# Patient Record
Sex: Male | Born: 1985 | Race: White | Hispanic: No | Marital: Married | Smoking: Never smoker
Health system: Southern US, Community
[De-identification: ages and names within clinical notes are randomized; demographics above are authoritative.]

## PROBLEM LIST (undated history)

## (undated) DIAGNOSIS — R030 Elevated blood-pressure reading, without diagnosis of hypertension: Principal | ICD-10-CM

## (undated) HISTORY — DX: Elevated blood-pressure reading, without diagnosis of hypertension: R03.0

---

## 2015-09-20 ENCOUNTER — Ambulatory Visit (INDEPENDENT_AMBULATORY_CARE_PROVIDER_SITE_OTHER): Payer: 59 | Admitting: Osteopathic Medicine

## 2015-09-20 ENCOUNTER — Encounter: Payer: Self-pay | Admitting: Osteopathic Medicine

## 2015-09-20 VITALS — BP 150/90 | HR 79 | Ht 70.0 in | Wt 287.0 lb

## 2015-09-20 DIAGNOSIS — R030 Elevated blood-pressure reading, without diagnosis of hypertension: Secondary | ICD-10-CM | POA: Diagnosis not present

## 2015-09-20 DIAGNOSIS — R42 Dizziness and giddiness: Secondary | ICD-10-CM

## 2015-09-20 DIAGNOSIS — R112 Nausea with vomiting, unspecified: Secondary | ICD-10-CM

## 2015-09-20 DIAGNOSIS — Z23 Encounter for immunization: Secondary | ICD-10-CM | POA: Diagnosis not present

## 2015-09-20 DIAGNOSIS — IMO0001 Reserved for inherently not codable concepts without codable children: Secondary | ICD-10-CM | POA: Insufficient documentation

## 2015-09-20 HISTORY — DX: Reserved for inherently not codable concepts without codable children: IMO0001

## 2015-09-20 MED ORDER — MECLIZINE HCL 25 MG PO CHEW: 25.0000 mg | CHEWABLE_TABLET | Freq: Two times a day (BID) | ORAL | Status: DC | PRN

## 2015-09-20 MED ORDER — CHLORTHALIDONE 25 MG PO TABS: 25.0000 mg | ORAL_TABLET | Freq: Every day | ORAL | Status: DC

## 2015-09-20 NOTE — Patient Instructions (Signed)

## 2015-09-20 NOTE — Progress Notes (Signed)
HPI: Johnathan Goodman is a 30 y.o. male who presents to [facility] today for chief complaint of:  Chief Complaint  Patient presents with  . Establish Care  . Headache  . Nausea   DIZZY/NAUSEA . Quality: dizzy . Duration: usually lasted about a few minutes to an hour then then goes away on its own . Timing: see below for recent episode, seems worse with rising from seated/lying down . Context: moment of dizziness at work, resolved but then got worse later in the day followed by nausea. Some dizziness previously, this is the first time caused nausea. Wife reports sporadic episodes of this for the past few months . Assoc signs/symptoms: Feels like spinning, no blackouts/LOC, no hx concussion.   HTN: BP 173/93 at intake on automatic cuffno history of high BP, pt adopted and does not know FH. Denies CP/SOB. Has been several years since last doctor visit/BP check.   Review of previous records:  BP 143/90 on [facility] records from 06/16/12 at which time he was seen and treated for Nausea/Vomiting, started on Omeprazole, Glc 101 at that time, not sure whether fasting. CMP otherwise normal at that time.     Past medical, social and family history reviewed: History reviewed. No pertinent past medical history. History reviewed. No pertinent past surgical history. Social History  Substance Use Topics  . Smoking status: Not on file  . Smokeless tobacco: Not on file  . Alcohol Use: Not on file   History reviewed. No pertinent family history.  No current outpatient prescriptions on file.   No current facility-administered medications for this visit.   No Known Allergies    Review of Systems: CONSTITUTIONAL:  No  fever, no chills, No  unintentional weight changes HEAD/EYES/EARS/NOSE/THROAT: (+) headache, no vision change, no hearing change, No  sore throat, No  sinus pressure CARDIAC: No  chest pain, No  pressure, No palpitations, No  orthopnea RESPIRATORY: No   cough, No  shortness of breath/wheeze GASTROINTESTINAL: (+) nausea, No  vomiting, No  abdominal pain, No  blood in stool, No  diarrhea, No  constipation  MUSCULOSKELETAL: No  myalgia/arthralgia GENITOURINARY: No  incontinence, No  abnormal genital bleeding/discharge SKIN: No  rash/wounds/concerning lesions HEM/ONC: No  easy bruising/bleeding, No  abnormal lymph node ENDOCRINE: No polyuria/polydipsia/polyphagia, No  heat/cold intolerance  NEUROLOGIC: No  weakness, (+) dizziness, No  slurred speech PSYCHIATRIC: No  concerns with depression, No  concerns with anxiety, No sleep problems  Exam:  BP 150/90 mmHg  Pulse 79  Ht 5\' 10"  (1.778 m)  Wt 287 lb (130.182 kg)  BMI 41.18 kg/m2 Constitutional: VS see above. General Appearance: alert, well-developed, well-nourished, NAD Eyes: Normal lids and conjunctive, non-icteric sclera, PERRLA Ears, Nose, Mouth, Throat: MMM, Normal external inspection ears/nares/mouth/lips/gums, TM normal, pharynx normal Neck: No masses, trachea midline. No thyroid enlargement/tenderness/mass appreciated. No lymphadenopathy Respiratory: Normal respiratory effort. no wheeze, no rhonchi, no rales Cardiovascular: S1/S2 normal, no murmur, no rub/gallop auscultated. RRR.  No carotid bruit or JVD. No abdominal aortic bruit. Pedal pulse II/IV bilaterally DP and PT. No lower extremity edema. Gastrointestinal: Nontender, no masses. No hepatomegaly, no splenomegaly. No hernia appreciated. Bowel sounds normal. Rectal exam deferred.  Musculoskeletal: Gait normal. No clubbing/cyanosis of digits.  Neurological: No cranial nerve deficit on limited exam. Motor and sensation intact and symmetric, cerebellar reflexes intact, EOMI no nystagmus Skin: warm, dry, intact. No rash/ulcer. No concerning nevi or subq nodules on limited exam.   Psychiatric: Normal judgment/insight. Normal mood and  affect. Oriented x3.    No results found for this or any previous visit (from the past 72  hour(s)).  EKG interpretation: Rate: 72 Rhythm: sinus No ST/T changes concerning for acute ischemia/infarct  Small Q waves II, III  Orthostatic VS - negative: see nurse notes/extended VS    ASSESSMENT/PLAN: ER precautions reviewed in detail. Pt instructions printed. F/u next week. May consider sleep study or w/u for other secondary cause based on labs and response to meds.   Elevated blood pressure - Plan: CBC with Differential/Platelet, COMPLETE METABOLIC PANEL WITH GFR, Hemoglobin A1c, Lipid panel, Magnesium, Urinalysis, chlorthalidone (HYGROTON) 25 MG tablet  Need for prophylactic vaccination and inoculation against influenza - Plan: Flu Vaccine QUAD 36+ mos IM  Dizziness - Plan: Hemoglobin A1c, TSH, Meclizine HCl 25 MG CHEW  Non-intractable vomiting with nausea, unspecified vomiting type - Plan: CBC with Differential/Platelet, COMPLETE METABOLIC PANEL WITH GFR, TSH, Meclizine HCl 25 MG CHEW   Return in about 5 days (around 09/25/2015), or sooner if needed, for RECHECK BLOOD PRESSURE, GO OVER LAB RESULTS IN DETAIL, RECHECK SYMPTOMS.

## 2015-09-21 LAB — URINALYSIS
BILIRUBIN URINE: NEGATIVE
GLUCOSE, UA: NEGATIVE
HGB URINE DIPSTICK: NEGATIVE
KETONES UR: NEGATIVE
Leukocytes, UA: NEGATIVE
Nitrite: NEGATIVE
PH: 5.5 (ref 5.0–8.0)
Protein, ur: NEGATIVE
Specific Gravity, Urine: 1.023 (ref 1.001–1.035)

## 2015-09-21 LAB — CBC WITH DIFFERENTIAL/PLATELET
Basophils Absolute: 0 10*3/uL (ref 0.0–0.1)
Basophils Relative: 0 % (ref 0–1)
EOS PCT: 2 % (ref 0–5)
Eosinophils Absolute: 0.2 10*3/uL (ref 0.0–0.7)
HEMATOCRIT: 44 % (ref 39.0–52.0)
Hemoglobin: 14.9 g/dL (ref 13.0–17.0)
LYMPHS ABS: 2.8 10*3/uL (ref 0.7–4.0)
LYMPHS PCT: 31 % (ref 12–46)
MCH: 28.6 pg (ref 26.0–34.0)
MCHC: 33.9 g/dL (ref 30.0–36.0)
MCV: 84.5 fL (ref 78.0–100.0)
MONO ABS: 0.8 10*3/uL (ref 0.1–1.0)
MONOS PCT: 9 % (ref 3–12)
MPV: 10.6 fL (ref 8.6–12.4)
Neutro Abs: 5.2 10*3/uL (ref 1.7–7.7)
Neutrophils Relative %: 58 % (ref 43–77)
Platelets: 242 10*3/uL (ref 150–400)
RBC: 5.21 MIL/uL (ref 4.22–5.81)
RDW: 14.1 % (ref 11.5–15.5)
WBC: 8.9 10*3/uL (ref 4.0–10.5)

## 2015-09-21 LAB — COMPLETE METABOLIC PANEL WITH GFR
ALK PHOS: 68 U/L (ref 40–115)
ALT: 27 U/L (ref 9–46)
AST: 19 U/L (ref 10–40)
Albumin: 4.2 g/dL (ref 3.6–5.1)
BUN: 11 mg/dL (ref 7–25)
CALCIUM: 9.1 mg/dL (ref 8.6–10.3)
CHLORIDE: 100 mmol/L (ref 98–110)
CO2: 32 mmol/L — ABNORMAL HIGH (ref 20–31)
Creat: 1 mg/dL (ref 0.60–1.35)
Glucose, Bld: 82 mg/dL (ref 65–99)
POTASSIUM: 4 mmol/L (ref 3.5–5.3)
Sodium: 142 mmol/L (ref 135–146)
Total Bilirubin: 1 mg/dL (ref 0.2–1.2)
Total Protein: 7 g/dL (ref 6.1–8.1)

## 2015-09-21 LAB — LIPID PANEL
CHOLESTEROL: 179 mg/dL (ref 125–200)
HDL: 33 mg/dL — ABNORMAL LOW (ref >=40)
LDL Cholesterol: 93 mg/dL (ref <130)
TRIGLYCERIDES: 265 mg/dL — AB (ref <150)
Total CHOL/HDL Ratio: 5.4 Ratio — ABNORMAL HIGH (ref <=5.0)
VLDL: 53 mg/dL — ABNORMAL HIGH (ref <30)

## 2015-09-21 LAB — TSH: TSH: 3.136 u[IU]/mL (ref 0.350–4.500)

## 2015-09-21 LAB — HEMOGLOBIN A1C
HEMOGLOBIN A1C: 5.7 % — AB (ref <5.7)
MEAN PLASMA GLUCOSE: 117 mg/dL — AB (ref <117)

## 2015-09-21 LAB — MAGNESIUM: MAGNESIUM: 1.9 mg/dL (ref 1.5–2.5)

## 2015-09-26 ENCOUNTER — Encounter: Payer: Self-pay | Admitting: Osteopathic Medicine

## 2015-09-26 ENCOUNTER — Ambulatory Visit (INDEPENDENT_AMBULATORY_CARE_PROVIDER_SITE_OTHER): Payer: 59 | Admitting: Osteopathic Medicine

## 2015-09-26 VITALS — BP 139/87 | HR 82 | Ht 69.0 in | Wt 281.0 lb

## 2015-09-26 DIAGNOSIS — R030 Elevated blood-pressure reading, without diagnosis of hypertension: Secondary | ICD-10-CM

## 2015-09-26 DIAGNOSIS — R42 Dizziness and giddiness: Secondary | ICD-10-CM | POA: Diagnosis not present

## 2015-09-26 DIAGNOSIS — IMO0001 Reserved for inherently not codable concepts without codable children: Secondary | ICD-10-CM

## 2015-09-26 NOTE — Patient Instructions (Signed)
Diabetes Mellitus and Food It is important for you to manage your blood sugar (glucose) level. Your blood glucose level can be greatly affected by what you eat. Eating healthier foods in the appropriate amounts throughout the day at about the same time each day will help you control your blood glucose level. It can also help slow or prevent worsening of your diabetes mellitus. Healthy eating may even help you improve the level of your blood pressure and reach or maintain a healthy weight.  General recommendations for healthful eating and cooking habits include:  Eating meals and snacks regularly. Avoid going long periods of time without eating to lose weight.  Eating a diet that consists mainly of plant-based foods, such as fruits, vegetables, nuts, legumes, and whole grains.  Using low-heat cooking methods, such as baking, instead of high-heat cooking methods, such as deep frying. Work with your dietitian to make sure you understand how to use the Nutrition Facts information on food labels. HOW CAN FOOD AFFECT ME? Carbohydrates Carbohydrates affect your blood glucose level more than any other type of food. Your dietitian will help you determine how many carbohydrates to eat at each meal and teach you how to count carbohydrates. Counting carbohydrates is important to keep your blood glucose at a healthy level, especially if you are using insulin or taking certain medicines for diabetes mellitus. Alcohol Alcohol can cause sudden decreases in blood glucose (hypoglycemia), especially if you use insulin or take certain medicines for diabetes mellitus. Hypoglycemia can be a life-threatening condition. Symptoms of hypoglycemia (sleepiness, dizziness, and disorientation) are similar to symptoms of having too much alcohol.  If your health care provider has given you approval to drink alcohol, do so in moderation and use the following guidelines:  Women should not have more than one drink per day, and men  should not have more than two drinks per day. One drink is equal to:  12 oz of beer.  5 oz of wine.  1 oz of hard liquor.  Do not drink on an empty stomach.  Keep yourself hydrated. Have water, diet soda, or unsweetened iced tea.  Regular soda, juice, and other mixers might contain a lot of carbohydrates and should be counted. WHAT FOODS ARE NOT RECOMMENDED? As you make food choices, it is important to remember that all foods are not the same. Some foods have fewer nutrients per serving than other foods, even though they might have the same number of calories or carbohydrates. It is difficult to get your body what it needs when you eat foods with fewer nutrients. Examples of foods that you should avoid that are high in calories and carbohydrates but low in nutrients include:  Trans fats (most processed foods list trans fats on the Nutrition Facts label).  Regular soda.  Juice.  Candy.  Sweets, such as cake, pie, doughnuts, and cookies.  Fried foods. WHAT FOODS CAN I EAT? Eat nutrient-rich foods, which will nourish your body and keep you healthy. The food you should eat also will depend on several factors, including:  The calories you need.  The medicines you take.  Your weight.  Your blood glucose level.  Your blood pressure level.  Your cholesterol level. You should eat a variety of foods, including:  Protein.  Lean cuts of meat.  Proteins low in saturated fats, such as fish, egg whites, and beans. Avoid processed meats.  Fruits and vegetables.  Fruits and vegetables that may help control blood glucose levels, such as apples, mangoes, and   yams.  Dairy products.  Choose fat-free or low-fat dairy products, such as milk, yogurt, and cheese.  Grains, bread, pasta, and rice.  Choose whole grain products, such as multigrain bread, whole oats, and brown rice. These foods may help control blood pressure.  Fats.  Foods containing healthful fats, such as nuts,  avocado, olive oil, canola oil, and fish. DOES EVERYONE WITH DIABETES MELLITUS HAVE THE SAME MEAL PLAN? Because every person with diabetes mellitus is different, there is not one meal plan that works for everyone. It is very important that you meet with a dietitian who will help you create a meal plan that is just right for you.   This information is not intended to replace advice given to you by your health care provider. Make sure you discuss any questions you have with your health care provider.   Document Released: 05/23/2005 Document Revised: 09/16/2014 Document Reviewed: 07/23/2013 Elsevier Interactive Patient Education 2016 Elsevier Inc.  Cholesterol Cholesterol is a white, waxy, fat-like substance needed by your body in small amounts. The liver makes all the cholesterol you need. Cholesterol is carried from the liver by the blood through the blood vessels. Deposits of cholesterol (plaque) may build up on blood vessel walls. These make the arteries narrower and stiffer. Cholesterol plaques increase the risk for heart attack and stroke.  You cannot feel your cholesterol level even if it is very high. The only way to know it is high is with a blood test. Once you know your cholesterol levels, you should keep a record of the test results. Work with your health care provider to keep your levels in the desired range.  WHAT DO THE RESULTS MEAN?  Total cholesterol is a rough measure of all the cholesterol in your blood.   LDL is the so-called bad cholesterol. This is the type that deposits cholesterol in the walls of the arteries. You want this level to be low.   HDL is the good cholesterol because it cleans the arteries and carries the LDL away. You want this level to be high.  Triglycerides are fat that the body can either burn for energy or store. High levels are closely linked to heart disease.  WHAT ARE THE DESIRED LEVELS OF CHOLESTEROL?  Total cholesterol below 200.   LDL below  100 for people at risk, below 70 for those at very high risk.   HDL above 50 is good, above 60 is best.   Triglycerides below 150.  HOW CAN I LOWER MY CHOLESTEROL?  Diet. Follow your diet programs as directed by your health care provider.   Choose fish or white meat chicken and [place], roasted or baked. Limit fatty cuts of red meat, fried foods, and processed meats, such as sausage and lunch meats.   Eat lots of fresh fruits and vegetables.  Choose whole grains, beans, pasta, potatoes, and cereals.   Use only small amounts of olive, corn, or canola oils.   Avoid butter, mayonnaise, shortening, or palm kernel oils.  Avoid foods with trans fats.   Drink skim or nonfat milk and eat low-fat or nonfat yogurt and cheeses. Avoid whole milk, cream, ice cream, egg yolks, and full-fat cheeses.   Healthy desserts include angel food cake, ginger snaps, animal crackers, hard candy, popsicles, and low-fat or nonfat frozen yogurt. Avoid pastries, cakes, pies, and cookies.   Exercise. Follow your exercise programs as directed by your health care provider.   A regular program helps decrease LDL and raise HDL.  A regular program helps with weight control.   Do things that increase your activity level like gardening, walking, or taking the stairs. Ask your health care provider about how you can be more active in your daily life.   Medicine. Take medicine only as directed by your health care provider.   Medicine may be prescribed by your health care provider to help lower cholesterol and decrease the risk for heart disease.   If you have several risk factors, you may need medicine even if your levels are normal.   This information is not intended to replace advice given to you by your health care provider. Make sure you discuss any questions you have with your health care provider.   Document Released: 05/21/2001 Document Revised: 09/16/2014 Document Reviewed:  06/09/2013 Elsevier Interactive Patient Education [employer]

## 2015-09-26 NOTE — Progress Notes (Signed)
HPI: Johnathan Goodman is a 30 y.o. male who presents to [facility] today for chief complaint of:  Chief Complaint  Patient presents with  . Follow-up    blood pressure   DIZZY/NAUSEA - pt initially seen for this last week. BP quite high, EKG and orthostatics ok. Labs drawn. Pt here today for BP recheck and lab results review. Labs ok except borderline cholesterol, A1C 5.7 = mild prediabetes. Pt has had no additional episodes dizziness. Has had a few night sweats and fatigue.    HTN: BP 173/93 at intake on  Last visit, no history of high BP, pt adopted and does not know FH. Denies CP/SOB. Review old records showed BP 143/90 on [facility] records from 06/16/12. Last visit started on chlorthalidone.     Past medical, social and family history reviewed: Past Medical History  Diagnosis Date  . Elevated blood pressure 09/20/2015   No past surgical history on file. Social History  Substance Use Topics  . Smoking status: Never Smoker   . Smokeless tobacco: Not on file  . Alcohol Use: Not on file   Family History  Problem Relation Age of Onset  . Adopted: Yes    Current Outpatient Prescriptions  Medication Sig Dispense Refill  . chlorthalidone (HYGROTON) 25 MG tablet Take 1 tablet (25 mg total) by mouth daily. 300 tablet 0  . Meclizine HCl 25 MG CHEW Chew 1 tablet (25 mg total) by mouth 2 (two) times daily as needed (dizziness/nausea). 90 each 0   No current facility-administered medications for this visit.   No Known Allergies    Review of Systems: CONSTITUTIONAL:  No  fever, no chills, No  unintentional weight changes HEAD/EYES/EARS/NOSE/THROAT: No headache, no vision change, no hearing change, No  sore throat, No  sinus pressure CARDIAC: No  chest pain, No  pressure, No palpitations, No  orthopnea RESPIRATORY: No  cough, No  shortness of breath/wheeze MUSCULOSKELETAL: No  myalgia/arthralgia ENDOCRINE: No polyuria/polydipsia/polyphagia, No  heat/cold  intolerance  NEUROLOGIC: No  weakness, No dizziness, No  slurred speech  Exam:  BP 139/87 mmHg  Pulse 82  Ht  (1.753 m)  Wt 281 lb (127.461 kg)  BMI 41.48 kg/m2 Constitutional: VS see above. General Appearance: alert, well-developed, well-nourished, NAD Eyes: Normal lids and conjunctive, non-icteric sclera, Respiratory: Normal respiratory effort. no wheeze, no rhonchi, no rales Cardiovascular: S1/S2 normal, no murmur, no rub/gallop auscultated. RRR.    ASSESSMENT/PLAN: ER precautions reviewed in detail. Pt instructions printed re: diet, discussed exercise and lifestyle modifications and plan to repeat A1C and lipids next 3 - 6 months. Check BMP 8 weeks after starting meds  Elevated blood pressure - improved on Chlorthalidone, RTC 3 months for recheck.  - Plan: Ambulatory referral to Sleep Studies, BASIC METABOLIC PANEL WITH GFR  Dizziness - resolved, pt has meclizine on hand, RTC if recurs   Return in about 3 months (around 12/25/2015), or sooner if needed, if dizziness comes back or other concerns, for BLOOD PRESSURE RECHECK. Return early March 2017 for lab redraw

## 2015-09-28 NOTE — Addendum Note (Signed)
Addended by: Collie Siad on: 09/28/2015 03:22 PM   Modules accepted: Orders

## 2015-10-20 ENCOUNTER — Other Ambulatory Visit: Payer: Self-pay | Admitting: Osteopathic Medicine

## 2015-10-20 DIAGNOSIS — IMO0001 Reserved for inherently not codable concepts without codable children: Secondary | ICD-10-CM

## 2015-10-20 DIAGNOSIS — R030 Elevated blood-pressure reading, without diagnosis of hypertension: Principal | ICD-10-CM

## 2015-10-20 MED ORDER — CHLORTHALIDONE 25 MG PO TABS: 25.0000 mg | ORAL_TABLET | Freq: Every day | ORAL | Status: DC

## 2015-10-23 ENCOUNTER — Other Ambulatory Visit: Payer: Self-pay | Admitting: Osteopathic Medicine

## 2015-10-25 ENCOUNTER — Telehealth: Payer: Self-pay | Admitting: Osteopathic Medicine

## 2015-10-25 NOTE — Telephone Encounter (Signed)
I called pt to let him know he is due for a f/u appt on his meds and BP BUT his voicemail is not set up. Thanks

## 2015-12-27 ENCOUNTER — Other Ambulatory Visit: Payer: Self-pay | Admitting: Osteopathic Medicine

## 2016-01-02 ENCOUNTER — Telehealth: Payer: Self-pay | Admitting: Osteopathic Medicine

## 2016-01-02 NOTE — Telephone Encounter (Signed)
I called pt to let him know that he is due for a F/u Appt on BP with Dr.Alexander and his voicemail is NOT set up for me to leave a message

## 2016-03-16 ENCOUNTER — Other Ambulatory Visit: Payer: Self-pay | Admitting: Osteopathic Medicine

## 2016-03-18 ENCOUNTER — Other Ambulatory Visit: Payer: Self-pay | Admitting: Osteopathic Medicine

## 2016-03-28 ENCOUNTER — Other Ambulatory Visit: Payer: Self-pay | Admitting: Osteopathic Medicine

## 2016-03-29 ENCOUNTER — Other Ambulatory Visit: Payer: Self-pay | Admitting: Osteopathic Medicine

## 2016-04-01 ENCOUNTER — Ambulatory Visit (INDEPENDENT_AMBULATORY_CARE_PROVIDER_SITE_OTHER): Payer: 59 | Admitting: Osteopathic Medicine

## 2016-04-01 ENCOUNTER — Encounter: Payer: Self-pay | Admitting: Osteopathic Medicine

## 2016-04-01 ENCOUNTER — Ambulatory Visit (INDEPENDENT_AMBULATORY_CARE_PROVIDER_SITE_OTHER): Payer: 59

## 2016-04-01 VITALS — BP 133/87 | HR 79 | Ht 69.0 in | Wt 283.0 lb

## 2016-04-01 DIAGNOSIS — Z23 Encounter for immunization: Secondary | ICD-10-CM

## 2016-04-01 DIAGNOSIS — M79672 Pain in left foot: Secondary | ICD-10-CM | POA: Diagnosis not present

## 2016-04-01 DIAGNOSIS — I10 Essential (primary) hypertension: Secondary | ICD-10-CM

## 2016-04-01 LAB — BASIC METABOLIC PANEL WITH GFR
BUN: 10 mg/dL (ref 7–25)
CHLORIDE: 99 mmol/L (ref 98–110)
CO2: 30 mmol/L (ref 20–31)
CREATININE: 0.96 mg/dL (ref 0.60–1.35)
Calcium: 9.6 mg/dL (ref 8.6–10.3)
GFR, Est African American: >89 mL/min (ref >=60)
GFR, Est Non African American: >89 mL/min (ref >=60)
GLUCOSE: 102 mg/dL — AB (ref 65–99)
Potassium: 3.8 mmol/L (ref 3.5–5.3)
Sodium: 138 mmol/L (ref 135–146)

## 2016-04-01 MED ORDER — CHLORTHALIDONE 25 MG PO TABS: 25.0000 mg | ORAL_TABLET | Freq: Every day | ORAL | 2 refills | Status: DC

## 2016-04-01 NOTE — Progress Notes (Signed)
HPI: Johnathan Goodman is a 30 y.o. Not Hispanic or Latino male  who presents to [facility] [place] today, 04/01/16,  for chief complaint of:  Chief Complaint  Patient presents with  . Follow-up    BLOOD PRESSURE  . Foot Pain    LEFT     Hypertension - Needs refill, BP improved On the medication. Patient to for follow-up BMP to monitor kidney function as well.   L foot pain . Context: previous injury 3 years ago, little toe caught on doorway and yanked to side - swollen and bruised several days,  . Location: L foot, 5th digit, lateral foot, pain on ball of the foot  . Quality: aching . Duration: ongoing about 3 years  . Timing: on and off, doesn't notice any significant pattern. . Modifying factors: Ibuprofen helps, stressing the foot 10 make it worse. Patient does occasionally have some difficulty with exercise such as jogging/elliptical/bike causing the foot to hurt . Assoc signs/symptoms: No numbness    Past medical, surgical, social and family history reviewed: Past Medical History:  Diagnosis Date  . Elevated blood pressure 09/20/2015   No past surgical history on file. Social History  Substance Use Topics  . Smoking status: Never Smoker  . Smokeless tobacco: Not on file  . Alcohol use Not on file   Family History  Problem Relation Age of Onset  . Adopted: Yes     Current medication list and allergy/intolerance information reviewed:   Current Outpatient Prescriptions  Medication Sig Dispense Refill  . chlorthalidone (HYGROTON) 25 MG tablet Take 1 tablet (25 mg total) by mouth daily. MUST KEEP APPOINTMENT 15 tablet 0  . Meclizine HCl 25 MG CHEW Chew 1 tablet (25 mg total) by mouth 2 (two) times daily as needed (dizziness/nausea). 90 each 0   No current facility-administered medications for this visit.    No Known Allergies    Review of Systems:  Constitutional:  No  fever, no chills, No recent illness. No significant fatigue.   HEENT:  No  headache, no vision change  Cardiac: No  chest pain, No  pressure, No palpitations,  Respiratory:  No  shortness of breath.  Musculoskeletal: (+) myalgia/arthralgia as per HPI  Neurologic: No  weakness, No  dizziness,    Exam:  BP 133/87   Pulse 79   Ht 5\' 9"  (1.753 m)   Wt 283 lb (128.4 kg)   BMI 41.79 kg/m   Constitutional: VS see above. General Appearance: alert, well-developed, well-nourished, NAD  Respiratory: Normal respiratory effort. no wheeze, no rhonchi, no rales  Cardiovascular: S1/S2 normal, no murmur, no rub/gallop auscultated. RRR. No lower extremity edema.  Musculoskeletal: Gait normal. No clubbing/cyanosis of digits. No visible deformity to left foot, no pain with eversion/inversion, drawer test negative, no tenderness to fifth MTP, no estrogen a mission to any of the toes, no swelling/ecchymoses.   Neurological: No cranial nerve deficit on limited exam. Motor and sensation intact and symmetric.  Skin: warm, dry, intact. No rash/ulcer    Dg Foot Complete Left  Result Date: 04/01/2016 CLINICAL DATA:  Direct trauma to the left fifth toe 3 years ago on a door frame with dislocation at the time; patient has experienced intermittent pain in the left fifth toe and fifth metatarsal since then. EXAM: LEFT FOOT - COMPLETE 3+ VIEW COMPARISON:  None in PACs FINDINGS: The bones are adequately mineralized. There is no acute fracture nor dislocation. Subtle cortical contour deformity of the lateral aspect of the  shaft of the proximal phalanx of the fifth toe may reflect the sequelae of previous injury. The metatarsals are intact. The joint spaces are preserved. The bones of the hindfoot are unremarkable. The soft tissues exhibit no abnormal swelling. IMPRESSION: There is no acute bony abnormality of the left foot. There are are likely old post traumatic changes of the proximal shaft of the proximal phalanx of the left fifth toe. Electronically Signed   By: David  [place] M.D.    On: 04/01/2016 11:25 Personally reviewed XR    ASSESSMENT/PLAN:   Essential hypertension - Update BMP for medication safety, continue current medications. Due for routine hypertensive screening January 2018 - Plan: chlorthalidone (HYGROTON) 25 MG tablet, BASIC METABOLIC PANEL WITH GFR  Left foot pain - X-ray as above, less likely occult/new fracture but consider MRI if no better, sports consult for possible orthotics - Plan: DG Foot Complete Left     Visit summary with medication list and pertinent instructions was printed for patient to review. All questions at time of visit were answered - patient instructed to contact office with any additional concerns. ER/RTC precautions were reviewed with the patient. Follow-up plan: Return in about 6 months (around 10/02/2016), or sooner if needed, for [facility] AND LABS.

## 2016-04-01 NOTE — Patient Instructions (Signed)
If foot pain persists - would recommend follow up with one of the sports medicine docs in our practice to discuss whether inserts or injections or other therapies may help the foot. Let us know! Otherwise, you'll be due for annual labs in January 2018!

## 2017-01-13 ENCOUNTER — Encounter: Payer: Self-pay | Admitting: Osteopathic Medicine

## 2017-01-13 ENCOUNTER — Other Ambulatory Visit: Payer: Self-pay | Admitting: Osteopathic Medicine

## 2017-01-13 ENCOUNTER — Ambulatory Visit (INDEPENDENT_AMBULATORY_CARE_PROVIDER_SITE_OTHER): Payer: BLUE CROSS/BLUE SHIELD | Admitting: Osteopathic Medicine

## 2017-01-13 VITALS — BP 129/83 | HR 93 | Ht 69.0 in | Wt 296.0 lb

## 2017-01-13 DIAGNOSIS — H6982 Other specified disorders of Eustachian tube, left ear: Secondary | ICD-10-CM | POA: Diagnosis not present

## 2017-01-13 MED ORDER — IPRATROPIUM BROMIDE 0.06 % NA SOLN: 2.0000 | Freq: Four times a day (QID) | NASAL | 0 refills | Status: DC

## 2017-01-13 MED ORDER — AMOXICILLIN-POT CLAVULANATE 875-125 MG PO TABS: 1.0000 | ORAL_TABLET | Freq: Two times a day (BID) | ORAL | 0 refills | Status: DC

## 2017-01-13 NOTE — Progress Notes (Signed)
HPI: Johnathan Goodman is a 31 y.o. male  who presents to [facility] Johnathan Goodman today, 01/13/17,  for chief complaint of:  Chief Complaint  Patient presents with  . Ear Pain    LEFT    Left ear concern . Context:Recent cold few weeks ago. Patient is not consistently taking allergy medications as he seemed to notice feeling a whole lot worse when he would skip a day by accident. . Location: Left ear . Quality: Feeling of fullness, like something is stuck in there. No hearing change. . Duration: Worse over past few days . Modifying factors: Sudafed not particularly helpful, has also tried Afrin which didn't help. Ibuprofen helped a bit with the discomfort. . Assoc signs/symptoms: No fever/chills, no jaw pain. Some sinus congestion bilaterally  Hypertension: On chlorthalidone, states not due for refills of medication. No chest pain, pressure, shortness of breath. Due for annual physical    Past medical history, surgical history, social history and family history reviewed.  Patient Active Problem List   Diagnosis Date Noted  . Left foot pain 04/01/2016  . Dizziness 09/26/2015  . Elevated blood pressure 09/20/2015    Current medication list and allergy/intolerance information reviewed.   Current Outpatient Prescriptions on File Prior to Visit  Medication Sig Dispense Refill  . chlorthalidone (HYGROTON) 25 MG tablet Take 1 tablet (25 mg total) by mouth daily. MUST KEEP APPOINTMENT 90 tablet 2   No current facility-administered medications on file prior to visit.    No Known Allergies    Review of Systems:  Constitutional: No recent illness  HEENT: No  headache, no vision change  Cardiac: No  chest pain, No  pressure, No palpitations  Respiratory:  No  shortness of breath. No  Cough  Gastrointestinal: No  abdominal pain, no change on bowel habits  Musculoskeletal: No new myalgia/arthralgia  Skin: No  Rash  Hem/Onc: No  easy bruising/bleeding, No   abnormal lumps/bumps  Neurologic: No  weakness, No  Dizziness  Psychiatric: No  concerns with depression, No  concerns with anxiety  Exam:  BP 129/83   Pulse 93   Ht 5\' 9"  (1.753 m)   Wt 296 lb (134.3 kg)   BMI 43.71 kg/m   Constitutional: VS see above. General Appearance: alert, well-developed, well-nourished, NAD  Eyes: Normal lids and conjunctive, non-icteric sclera  Ears, Nose, Mouth, Throat: MMM, Normal external inspection ears/nares/mouth/lips/gums.Normal nasopharynx. Tympanic membranes normal on right. On left, external canal clear. Light reflex intact, landmarks visible, membrane is not dull or bulging. There is some clear effusion behind the TM but also some orange-colored consistent with possible serosanguineous effusion in inferior posterior quadrant. No hemotympanum  Neck: No masses, trachea midline. No lymphadenopathy  Respiratory: Normal respiratory effort.        ASSESSMENT/PLAN:   Eustachian tube dysfunction, left - Plan: ipratropium (ATROVENT) 0.06 % nasal spray, amoxicillin-clavulanate (AUGMENTIN) 875-125 MG tablet    Patient Instructions  Plan: I think the eustachian tube is blocked, possibly due to inflammation from allergies or recent illness, possibly due to an early bacterial infection 1. Try OTC treatments with Sudafed + Allegra/Claritin/Zyrtec, can use nasal saline spray, add Atrovent prescription. Try these medicines for the next few days.  2. If not working or if pain is getting more bothersome, fill the prescription for the antibiotic 3. If nothing is helping, or if you continue to worsen, please come see [place] in the office. We may need to send to an Ear-Nose-Throat specialist  Follow-up plan: Return if symptoms worsen or fail to improve, and next few months when due for annual physical.  Visit summary with medication list and pertinent instructions was printed for patient to review, alert us if any changes needed. All questions at time  of visit were answered - patient instructed to contact office with any additional concerns. ER/RTC precautions were reviewed with the patient and understanding verbalized.

## 2017-01-13 NOTE — Patient Instructions (Signed)
Plan: I think the eustachian tube is blocked, possibly due to inflammation from allergies or recent illness, possibly due to an early bacterial infection 1. Try OTC treatments with Sudafed + Allegra/Claritin/Zyrtec, can use nasal saline spray, add Atrovent prescription. Try these medicines for the next few days.  2. If not working or if pain is getting more bothersome, fill the prescription for the antibiotic 3. If nothing is helping, or if you continue to worsen, please come see us in the office. We may need to send to an Ear-Nose-Throat specialist

## 2017-01-27 ENCOUNTER — Other Ambulatory Visit: Payer: Self-pay | Admitting: Osteopathic Medicine

## 2017-01-27 DIAGNOSIS — I10 Essential (primary) hypertension: Secondary | ICD-10-CM

## 2017-02-12 IMAGING — DX DG FOOT COMPLETE 3+V*L*
3 series · 3 of 3 positions shown · non-contrast
Comparison: None in PACs

CLINICAL DATA: Direct trauma to the left fifth toe 3 years ago on a
door frame with dislocation at the time; patient has experienced
intermittent pain in the left fifth toe and fifth metatarsal since
then.

EXAM:
LEFT FOOT - COMPLETE 3+ VIEW

[foot ap]
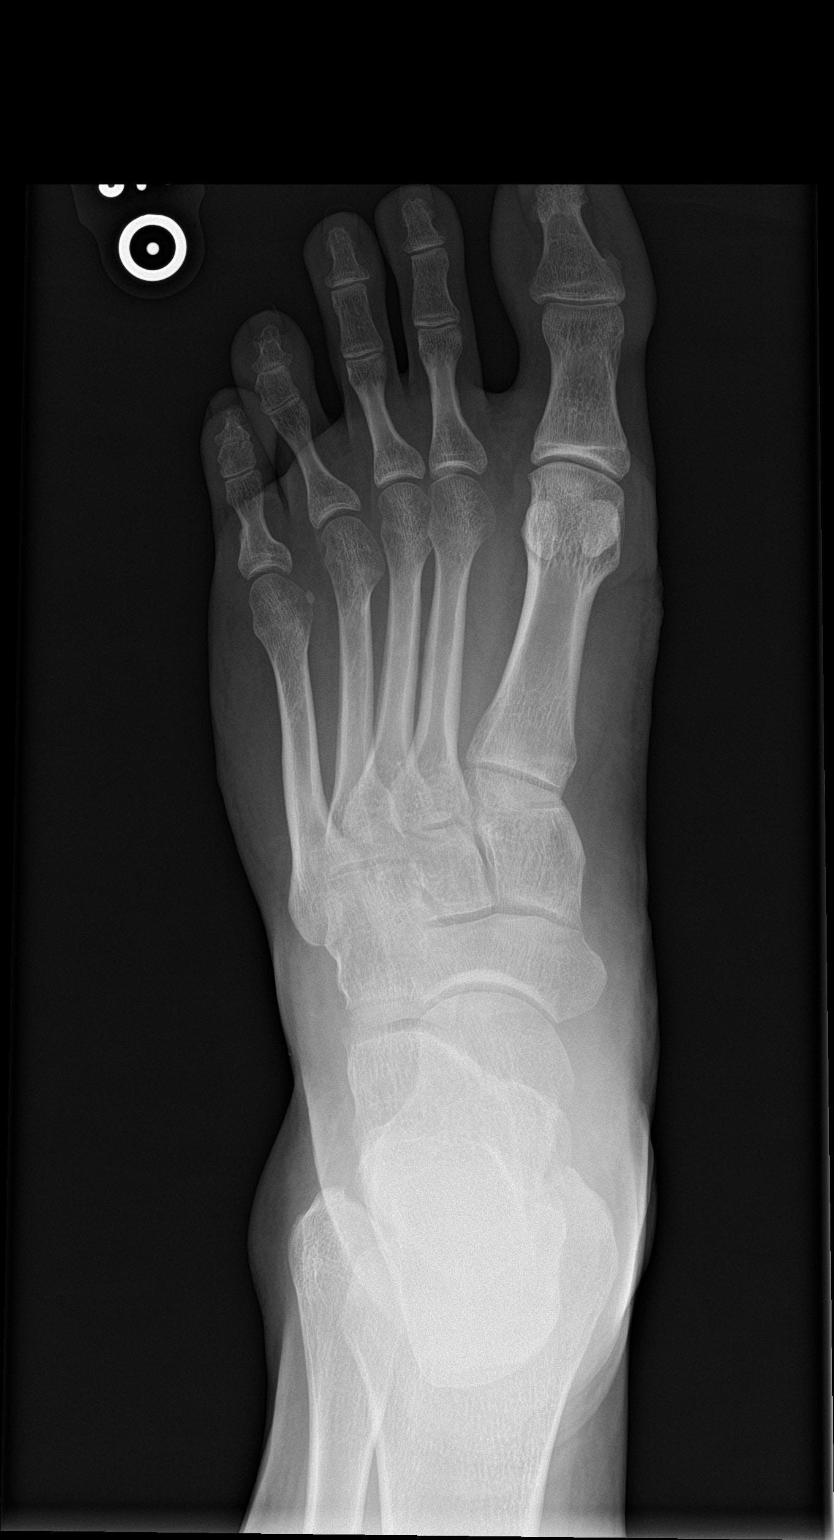

[foot obl]
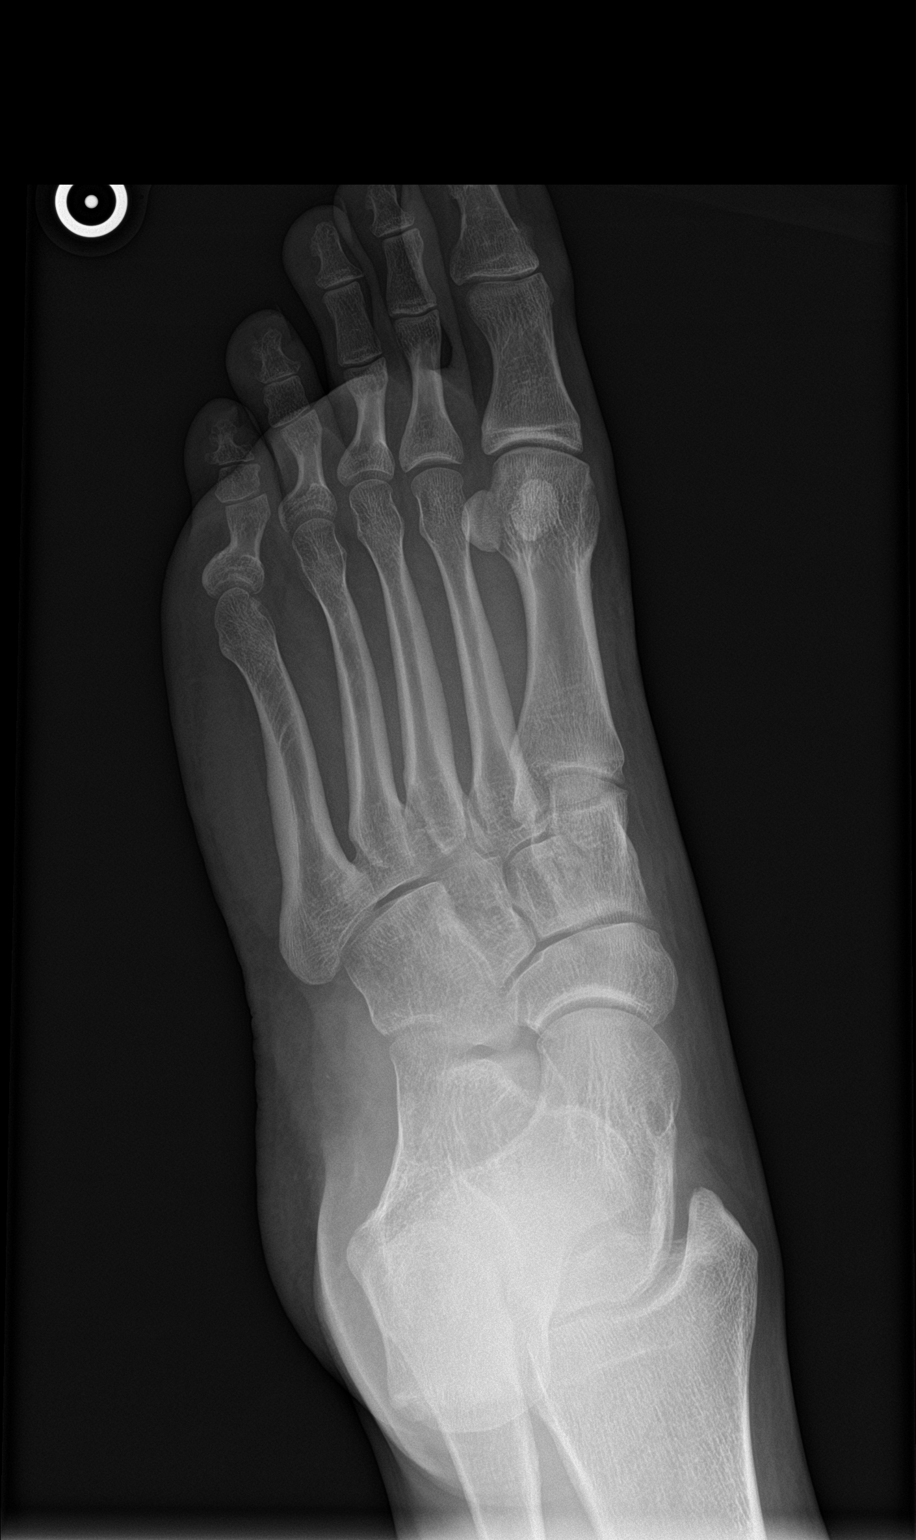

[foot lat]
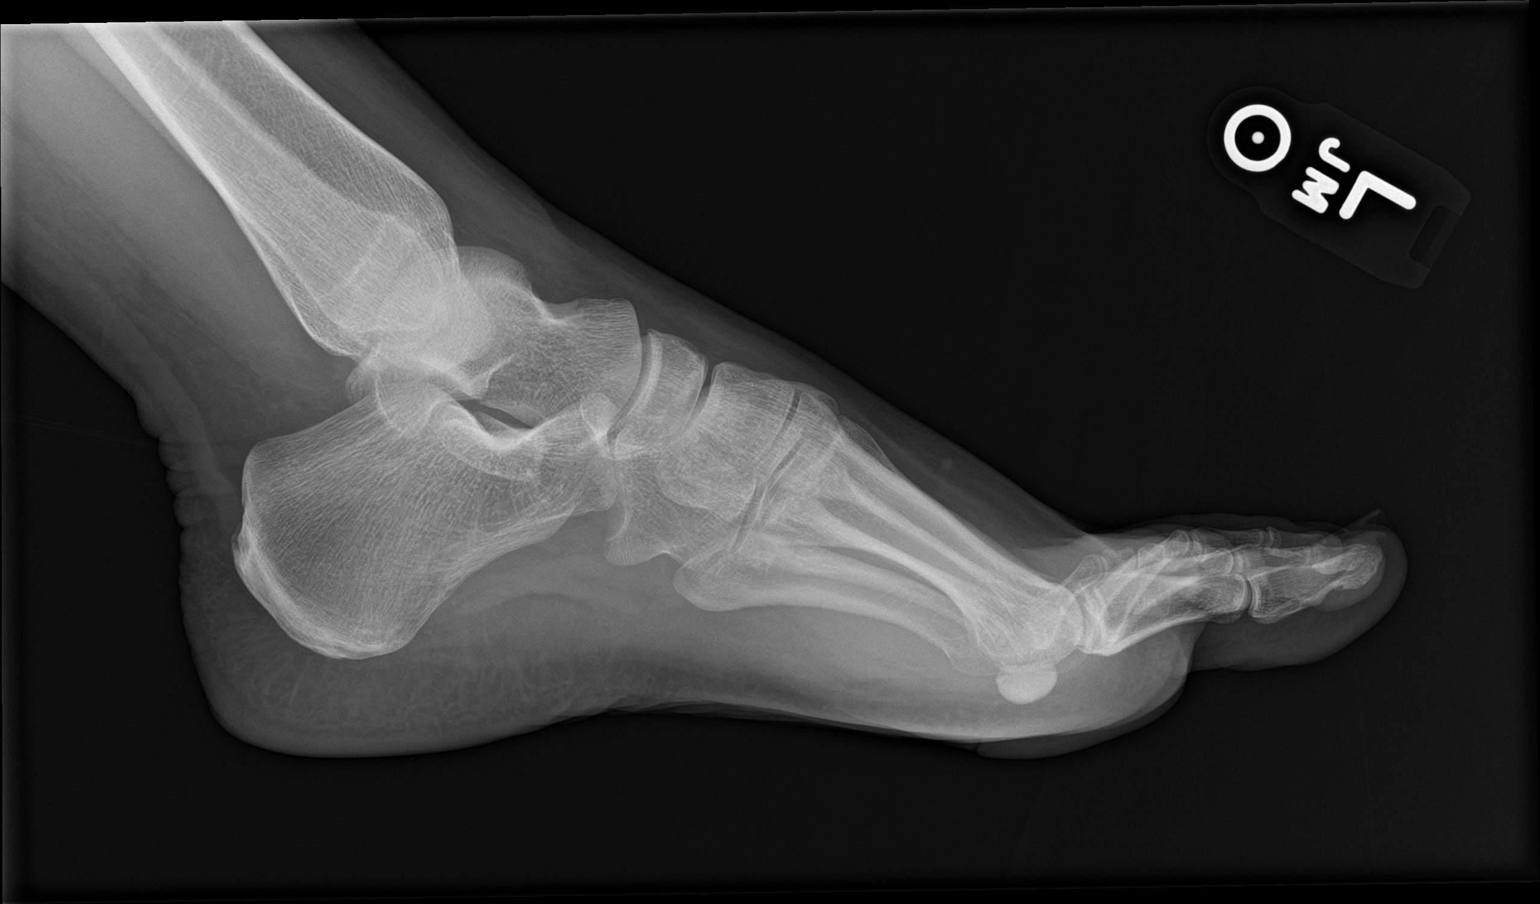

[3 of 3 positions shown; findings below may reference images not displayed]

FINDINGS: The bones are adequately mineralized. There is no acute fracture nor
dislocation. Subtle cortical contour deformity of the lateral aspect
of the shaft of the proximal phalanx of the fifth toe may reflect
the sequelae of previous injury. The metatarsals are intact. The
joint spaces are preserved. The bones of the hindfoot are
unremarkable. The soft tissues exhibit no abnormal swelling.
IMPRESSION: There is no acute bony abnormality of the left foot. There are are
likely old post traumatic changes of the proximal shaft of the
proximal phalanx of the left fifth toe.

## 2017-04-25 ENCOUNTER — Other Ambulatory Visit: Payer: Self-pay | Admitting: Osteopathic Medicine

## 2017-04-25 DIAGNOSIS — I10 Essential (primary) hypertension: Secondary | ICD-10-CM

## 2017-07-29 ENCOUNTER — Other Ambulatory Visit: Payer: Self-pay | Admitting: Osteopathic Medicine

## 2017-07-29 DIAGNOSIS — I10 Essential (primary) hypertension: Secondary | ICD-10-CM

## 2017-10-29 ENCOUNTER — Other Ambulatory Visit: Payer: Self-pay | Admitting: Osteopathic Medicine

## 2017-10-29 DIAGNOSIS — I10 Essential (primary) hypertension: Secondary | ICD-10-CM

## 2017-12-01 ENCOUNTER — Other Ambulatory Visit: Payer: Self-pay

## 2017-12-01 ENCOUNTER — Telehealth: Payer: Self-pay | Admitting: Osteopathic Medicine

## 2017-12-01 DIAGNOSIS — I10 Essential (primary) hypertension: Secondary | ICD-10-CM

## 2017-12-01 MED ORDER — CHLORTHALIDONE 25 MG PO TABS: 25.0000 mg | ORAL_TABLET | Freq: Every day | ORAL | 0 refills | Status: DC

## 2017-12-01 NOTE — Telephone Encounter (Signed)
Thank you. I have [facility] 15 d/[facility]. Notations written to pharmacist that "pt must keep upcoming appt on 12/16/17 for further refill on medication". Pt has been updated. No other inquiries asked during call.

## 2017-12-01 NOTE — Telephone Encounter (Signed)
I scheduled Mr. Johnathan Goodman for his annual on 12/16/17 @10 :50. Are you able to call refills in for him? If you can, I will call him to let him know.

## 2017-12-15 DIAGNOSIS — Z0189 Encounter for other specified special examinations: Secondary | ICD-10-CM

## 2017-12-16 ENCOUNTER — Telehealth: Payer: Self-pay | Admitting: Osteopathic Medicine

## 2017-12-16 ENCOUNTER — Encounter: Payer: BLUE CROSS/BLUE SHIELD | Admitting: Osteopathic Medicine

## 2017-12-16 ENCOUNTER — Other Ambulatory Visit: Payer: Self-pay

## 2017-12-16 DIAGNOSIS — I10 Essential (primary) hypertension: Secondary | ICD-10-CM

## 2017-12-16 MED ORDER — CHLORTHALIDONE 25 MG PO TABS: ORAL_TABLET | ORAL | 0 refills | Status: DC

## 2017-12-16 NOTE — Telephone Encounter (Signed)
Thank you for the update. Dr. Lyn HollingsheadAlexander has approved refill for chlorthalidone medication. [facility]. Pt has been updated & notified that he must keep f/u appt w/Pcp on 12/19/17 for further refills on medication.

## 2017-12-16 NOTE — Telephone Encounter (Signed)
Patient arrived late for his appointment today due to being stuck in traffic. I have rescheduled his Physical for Friday, 12/19/17. He took his last chlorthalidone today. Can we send in a couple days worth to get him through until Friday or can he wait until Friday? Please advise. Thanks!

## 2017-12-19 ENCOUNTER — Encounter: Payer: Self-pay | Admitting: Osteopathic Medicine

## 2017-12-19 ENCOUNTER — Ambulatory Visit (INDEPENDENT_AMBULATORY_CARE_PROVIDER_SITE_OTHER): Payer: BLUE CROSS/BLUE SHIELD | Admitting: Osteopathic Medicine

## 2017-12-19 VITALS — BP 125/72 | HR 72 | Temp 98.2°F | Wt 280.1 lb

## 2017-12-19 DIAGNOSIS — I10 Essential (primary) hypertension: Secondary | ICD-10-CM | POA: Diagnosis not present

## 2017-12-19 DIAGNOSIS — F411 Generalized anxiety disorder: Secondary | ICD-10-CM

## 2017-12-19 DIAGNOSIS — Z0000 Encounter for general adult medical examination without abnormal findings: Secondary | ICD-10-CM

## 2017-12-19 MED ORDER — CHLORTHALIDONE 25 MG PO TABS
ORAL_TABLET | ORAL | 3 refills | Status: DC
Start: 2017-12-19 — End: 2019-01-20

## 2017-12-19 NOTE — Progress Notes (Signed)
HPI: Johnathan Goodman is a 32 y.o. male who  has a past medical history of Elevated blood pressure (09/20/2015).  he presents to [facility] today, 12/19/17,  for chief complaint of: Annual Physical Some anxiety  Patient here for annual physical / wellness exam.  See preventive care reviewed as below.  Recent labs reviewed in detail with the patient.   Additional concerns today include:  Some anxiety issues, can connect to certain triggers and stressors. Would like to discuss counseling/therapy versus meds at this time.   Past medical, surgical, social and family history reviewed:  Patient Active Problem List   Diagnosis Date Noted  . Left foot pain 04/01/2016  . Dizziness 09/26/2015  . Elevated blood pressure 09/20/2015    No past surgical history on file.  Social History   Tobacco Use  . Smoking status: Never Smoker  . Smokeless tobacco: Never Used  Substance Use Topics  . Alcohol use: Not on file    Family History  Adopted: Yes     Current medication list and allergy/intolerance information reviewed:    Current Outpatient Medications  Medication Sig Dispense Refill  . amoxicillin-clavulanate (AUGMENTIN) 875-125 MG tablet Take 1 tablet by mouth 2 (two) times daily. 14 tablet 0  . chlorthalidone (HYGROTON) 25 MG tablet TAKE 1 TABLET(25 MG) BY MOUTH DAILY 90 tablet 0  . chlorthalidone (HYGROTON) 25 MG tablet TAKE 1 TABLET(25 MG) BY MOUTH DAILY 90 tablet 0  . chlorthalidone (HYGROTON) 25 MG tablet Take 1 tablet (25 mg total) by mouth daily. Pt must keep appt on 12/16/17 for further refills. 15 tablet 0  . chlorthalidone (HYGROTON) 25 MG tablet TAKE 1 TABLET(25 MG) BY MOUTH DAILY 30 tablet 0  . ipratropium (ATROVENT) 0.06 % nasal spray Place 2 sprays into both nostrils 4 (four) times daily. 15 mL 0   No current facility-administered medications for this visit.     No Known Allergies    Review of Systems:  Constitutional:  No  fever,  no chills, No recent illness, No unintentional weight changes. No significant fatigue.   HEENT: No  headache, no vision change, no hearing change, No sore throat, No  sinus pressure  Cardiac: No  chest pain, No  pressure, No palpitations, No  Orthopnea  Respiratory:  No  shortness of breath. No  Cough  Gastrointestinal: No  abdominal pain, No  nausea, No  vomiting,  No  blood in stool, No  diarrhea, No  constipation   Musculoskeletal: No new myalgia/arthralgia  Skin: No  Rash, No other wounds/concerning lesions  Genitourinary: No  incontinence, No  abnormal genital bleeding, No abnormal genital discharge  Hem/Onc: No  easy bruising/bleeding, No  abnormal lymph node  Endocrine: No cold intolerance,  No heat intolerance. No polyuria/polydipsia/polyphagia   Neurologic: No  weakness, No  dizziness, No  slurred speech/focal weakness/facial droop  Psychiatric: No  concerns with depression, +concerns with anxiety, No sleep problems, No mood problems  Exam:  BP 125/72 (BP Location: Left Arm, Patient Position: Sitting, Cuff Size: Large)   Pulse 72   Temp 98.2 F (36.8 C) (Oral)   Wt 280 lb 1.9 oz (127.1 kg)   BMI 41.37 kg/m   Constitutional: VS see above. General Appearance: alert, well-developed, well-nourished, NAD  Eyes: Normal lids and conjunctive, non-icteric sclera  Ears, Nose, Mouth, Throat: MMM, Normal external inspection ears/nares/mouth/lips/gums. TM normal bilaterally. Pharynx/tonsils no erythema, no exudate. Nasal mucosa normal.   Neck: No masses, trachea midline. No thyroid enlargement.  No tenderness/mass appreciated. No lymphadenopathy  Respiratory: Normal respiratory effort. no wheeze, no rhonchi, no rales  Cardiovascular: S1/S2 normal, no murmur, no rub/gallop auscultated. RRR. No lower extremity edema.   Gastrointestinal: Nontender, no masses. No hepatomegaly, no splenomegaly. No hernia appreciated. Bowel sounds normal. Rectal exam deferred.   Musculoskeletal:  Gait normal. No clubbing/cyanosis of digits.   Neurological: Normal balance/coordination. No tremor. No cranial nerve deficit on limited exam. Motor and sensation intact and symmetric. Cerebellar reflexes intact.   Skin: warm, dry, intact. No rash/ulcer. No concerning nevi or subq nodules on limited exam.    Psychiatric: Normal judgment/insight. Normal mood and affect. Oriented x3.   Depression screen [facility] 2/9 12/19/2017 09/20/2015  Decreased Interest 0 0  Down, Depressed, Hopeless 1 0  PHQ - 2 Score 1 0  Altered sleeping 0 -  Tired, decreased energy 0 -  Change in appetite 0 -  Feeling bad or failure about yourself  1 -  Trouble concentrating 0 -  Moving slowly or fidgety/restless 0 -  Suicidal thoughts 0 -  PHQ-9 Score 2 -  Difficult doing work/chores Somewhat difficult -   GAD 7 : Generalized Anxiety Score 12/19/2017  Nervous, Anxious, on Edge 1  Control/stop worrying 1  Worry too much - different things 0  Trouble relaxing 1  Restless 0  Easily annoyed or irritable 0  Afraid - awful might happen 0  Total GAD 7 Score 3  Anxiety Difficulty Somewhat difficult         ASSESSMENT/PLAN:   Annual physical exam - Plan: CBC, COMPLETE METABOLIC PANEL WITH GFR, Lipid panel, HIV antibody  Essential hypertension - Plan: CBC, COMPLETE METABOLIC PANEL WITH GFR, Lipid panel, chlorthalidone (HYGROTON) 25 MG tablet  Anxiety state - Plan: Ambulatory referral to Behavioral Health   Orders Placed This Encounter  Procedures  . CBC  . COMPLETE METABOLIC PANEL WITH GFR  . Lipid panel  . HIV antibody  . Ambulatory referral to Behavioral Health    Referral Priority:   Routine    Referral Type:   Psychiatric    Referral Reason:   Specialty Services Required    Requested Specialty:   Behavioral Health    Number of Visits Requested:   1    MALE PREVENTIVE CARE  updated 12/19/17  ANNUAL SCREENING/COUNSELING  Any changes to health in the past year? no  Diet/Exercise - HEALTHY  HABITS DISCUSSED TO DECREASE CV RISK Social History   Tobacco Use  Smoking Status Never Smoker  Smokeless Tobacco Never Used   Social History   Substance and Sexual Activity  Alcohol Use Not on file   [facility] 12/19/2017  Decreased Interest 0  Down, Depressed, Hopeless 1  PHQ - 2 Score 1  Altered sleeping 0  Tired, decreased energy 0  Change in appetite 0  Feeling bad or failure about yourself  1  Trouble concentrating 0  Moving slowly or fidgety/restless 0  Suicidal thoughts 0  PHQ-9 Score 2  Difficult doing work/chores Somewhat difficult    SEXUAL/REPRODUCTIVE HEALTH  Sexually active in the past year? - Yes with male.  STI testing needed/desired today? - no  Any concerns with testosterone/libido? - no  INFECTIOUS DISEASE SCREENING  HIV - needs  GC/CT - does not need  HepC - does not need  TB - does not need  CANCER SCREENING  Lung - does not need  Colon - does not need  Prostate - does not need  OTHER DISEASE SCREENING  Lipid -  needs  DM2 - needs  AAA - 65-75yo ever smoked: does not need  Osteoporosis - men 33yo+ - does not need  ADULT VACCINATION  Influenza - annual vaccine recommended  Td - booster every 10 years   Zoster - Shingrix recommended 18+ years old  PCV13 - was not indicated  PPSV23 - was not indicated Immunization History  Administered Date(s) Administered  . Influenza,inj,Quad PF,6+ Mos 09/20/2015  . Tdap 04/01/2016    Patient Instructions  Goal BP 130/80 or less Come see me if it's higher!       Visit summary with medication list and pertinent instructions was printed for patient to review. All questions at time of visit were answered - patient instructed to contact office with any additional concerns. ER/RTC precautions were reviewed with the patient.   Follow-up plan: Return in about 1 year (around 12/20/2018) for annual wellness physical, sooner if needed .    Please note: voice  recognition software was used to produce this document, and typos may escape review. Please contact Dr. Lyn Hollingshead for any needed clarifications.

## 2017-12-19 NOTE — Patient Instructions (Signed)
Goal BP 130/80 or less Come see me if it's higher!

## 2017-12-20 LAB — CBC
HCT: 44.1 % (ref 38.5–50.0)
HEMOGLOBIN: 15 g/dL (ref 13.2–17.1)
MCH: 28.4 pg (ref 27.0–33.0)
MCHC: 34 g/dL (ref 32.0–36.0)
MCV: 83.4 fL (ref 80.0–100.0)
MPV: 11.3 fL (ref 7.5–12.5)
Platelets: 251 10*3/uL (ref 140–400)
RBC: 5.29 10*6/uL (ref 4.20–5.80)
RDW: 13 % (ref 11.0–15.0)
WBC: 7.8 10*3/uL (ref 3.8–10.8)

## 2017-12-20 LAB — LIPID PANEL
Cholesterol: 183 mg/dL (ref <200)
HDL: 39 mg/dL — ABNORMAL LOW (ref >40)
LDL CHOLESTEROL (CALC): 116 mg/dL — AB
Non-HDL Cholesterol (Calc): 144 mg/dL (calc) — ABNORMAL HIGH (ref <130)
Total CHOL/HDL Ratio: 4.7 (calc) (ref <5.0)
Triglycerides: 168 mg/dL — ABNORMAL HIGH (ref <150)

## 2017-12-20 LAB — COMPLETE METABOLIC PANEL WITH GFR
AG RATIO: 1.5 (calc) (ref 1.0–2.5)
ALBUMIN MSPROF: 4.4 g/dL (ref 3.6–5.1)
ALT: 46 U/L (ref 9–46)
AST: 30 U/L (ref 10–40)
Alkaline phosphatase (APISO): 76 U/L (ref 40–115)
BILIRUBIN TOTAL: 2 mg/dL — AB (ref 0.2–1.2)
BUN: 10 mg/dL (ref 7–25)
CHLORIDE: 98 mmol/L (ref 98–110)
CO2: 34 mmol/L — ABNORMAL HIGH (ref 20–32)
Calcium: 9.4 mg/dL (ref 8.6–10.3)
Creat: 0.95 mg/dL (ref 0.60–1.35)
GFR, Est African American: 122 mL/min/{1.73_m2} (ref >=60)
GFR, Est Non African American: 105 mL/min/{1.73_m2} (ref >=60)
GLUCOSE: 101 mg/dL — AB (ref 65–99)
Globulin: 2.9 g/dL (calc) (ref 1.9–3.7)
Potassium: 3.6 mmol/L (ref 3.5–5.3)
Sodium: 140 mmol/L (ref 135–146)
TOTAL PROTEIN: 7.3 g/dL (ref 6.1–8.1)

## 2017-12-20 LAB — HIV ANTIBODY (ROUTINE TESTING W REFLEX): HIV 1&2 Ab, 4th Generation: NONREACTIVE

## 2018-03-13 ENCOUNTER — Telehealth: Payer: Self-pay | Admitting: *Deleted

## 2018-03-13 ENCOUNTER — Encounter: Payer: Self-pay | Admitting: Family Medicine

## 2018-03-13 ENCOUNTER — Ambulatory Visit (INDEPENDENT_AMBULATORY_CARE_PROVIDER_SITE_OTHER): Payer: BLUE CROSS/BLUE SHIELD | Admitting: Family Medicine

## 2018-03-13 VITALS — BP 128/73 | HR 79 | Ht 69.0 in | Wt 286.0 lb

## 2018-03-13 DIAGNOSIS — S46811A Strain of other muscles, fascia and tendons at shoulder and upper arm level, right arm, initial encounter: Secondary | ICD-10-CM | POA: Diagnosis not present

## 2018-03-13 MED ORDER — CYCLOBENZAPRINE HCL 10 MG PO TABS: 5.0000 mg | ORAL_TABLET | Freq: Three times a day (TID) | ORAL | 0 refills | Status: DC | PRN

## 2018-03-13 NOTE — Telephone Encounter (Signed)
Pt stated that he forgot to inform Dr. Linford ArnoldMetheney that when he takes a deep breath in he has pain in the area where his shoulder hurts. He denies any SOB or pain w/breathing prior to injury. Will fwd to pcp for advice.  He also asked about the muscle relaxer.Heath GoldBarkley, Cortnee Steinmiller Lynetta, CMA

## 2018-03-13 NOTE — Addendum Note (Signed)
Addended by: Nani GasserMETHENEY, Lyrika Souders D on: 03/13/2018 11:27 AM   Modules accepted: Orders

## 2018-03-13 NOTE — Patient Instructions (Addendum)
Continue with ibuprofen 600 mg 3 times a day.  We can also try muscle relaxer at bedtime to see if this is helpful as well.   If not improving over 10 days then please let me know.  Also recommend a trial of heat and/or ice to the area 2-3 times a day if possible.  Please see handout for additional stretches.

## 2018-03-13 NOTE — Progress Notes (Signed)
Subjective:    Patient ID: Johnathan RilyKeith Goodman, male    DOB: 07/14/1986, 32 y.o.   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  He says he started to feel little soreness between his spine and right scapula last night before he went to bed.  There was no immediate or traumatic pain during his work.  But then this morning when he woke up he was having pain more over the right upper trapezius between the neck and the right shoulder.  And a little bit going posteriorly towards his shoulder.  He says he has normal range of motion.  His pain was 8 out of 10 this morning but then took 600 mg's of ibuprofen and says his pain is now down to about a 5 out of 10.  No previous injuries to this area.    Review of Systems BP (!) 156/82   Pulse 79   Ht 5\' 9"  (1.753 m)   Wt 286 lb (129.7 kg)   SpO2 99%   BMI 42.23 kg/m     No Known Allergies  Past Medical History:  Diagnosis Date  . Elevated blood pressure 09/20/2015    No past surgical history on file.  Social History   Socioeconomic History  . Marital status: Married    Spouse name: Not on file  . Number of children: Not on file  . Years of education: Not on file  . Highest education level: Not on file  Occupational History  . Not on file  Social Needs  . Financial resource strain: Not on file  . Food insecurity:    Worry: Not on file    Inability: Not on file  . Transportation needs:    Medical: Not on file    Non-medical: Not on file  Tobacco Use  . Smoking status: Never Smoker  . Smokeless tobacco: Never Used  Substance and Sexual Activity  . Alcohol use: Yes    Alcohol/week: 0.0 oz    Comment: infrequent social alcohol consumption   . Drug use: Never  . Sexual activity: Yes  Lifestyle  . Physical activity:    Days per week: Not on file    Minutes per session: Not on file  . Stress: Not on file  Relationships  . Social  connections:    Talks on phone: Not on file    Gets together: Not on file    Attends religious service: Not on file    Active member of club or organization: Not on file    Attends meetings of clubs or organizations: Not on file    Relationship status: Not on file  . Intimate partner violence:    Fear of current or ex partner: Not on file    Emotionally abused: Not on file    Physically abused: Not on file    Forced sexual activity: Not on file  Other Topics Concern  . Not on file  Social History Narrative  . Not on file    Family History  Adopted: Yes    Outpatient Encounter Medications as of 03/13/2018  Medication Sig  . chlorthalidone (HYGROTON) 25 MG tablet TAKE 1 TABLET(25 MG) BY MOUTH DAILY   No facility-administered encounter medications on file as of 03/13/2018.          Objective:   Physical Exam  Constitutional: He is oriented to person,  place, and time. He appears well-developed and well-nourished.  HENT:  Head: Normocephalic and atraumatic.  Eyes: Conjunctivae and EOM are normal.  Cardiovascular: Normal rate.  Pulmonary/Chest: Effort normal.  Musculoskeletal:  Cervical spine with normal flexion, extension, rotation right left and side bending.  Though he did have pain over the right trapezius when bending the cervical spine to the left.  Nontender over the cervical and thoracic spine.  Nontender over the paraspinous muscles.  He is tender over the right upper trapezius.  Both shoulders with normal range of motion.  Strength is 5 out of 5 bilaterally.  And negative empty can test.  Reflexes are 0+ in the upper extremities bilaterally.  Neurological: He is alert and oriented to person, place, and time.  Skin: Skin is dry. No pallor.  Psychiatric: He has a normal mood and affect. His behavior is normal.  Vitals reviewed.       Assessment & Plan:  Right trapezius strain-discussed diagnosis with him I do think this is just a muscle injury.  No x-rays needed or  warranted at this point.  Continue with ibuprofen 600 mg 3 times a day.  We can also try muscle relaxer at bedtime to see if this is helpful as well.  If not improving over 10 days then please let me know.  Also recommend a trial of heat and/or ice to the area 2-3 times a day if possible.  Please see handout for additional stretches.

## 2018-03-13 NOTE — Telephone Encounter (Signed)
Reviewed Dr Shelah LewandowskyMetheney's note, even w/ this symptoms sounds muscle related. Looks like she sent flexeril to the pharmacy today: [facility] [place], nothing else to do I'd say, unless he is feeling worse/change, no improvement he should follow up with me or sports med

## 2018-08-03 ENCOUNTER — Emergency Department (INDEPENDENT_AMBULATORY_CARE_PROVIDER_SITE_OTHER)
Admission: EM | Admit: 2018-08-03 | Discharge: 2018-08-03 | Disposition: A | Discharge status: Home / Self Care | Payer: BLUE CROSS/BLUE SHIELD | Source: Home / Self Care

## 2018-08-03 ENCOUNTER — Encounter: Payer: Self-pay | Admitting: Family Medicine

## 2018-08-03 ENCOUNTER — Other Ambulatory Visit: Payer: Self-pay

## 2018-08-03 DIAGNOSIS — S300XXA Contusion of lower back and pelvis, initial encounter: Secondary | ICD-10-CM | POA: Diagnosis not present

## 2018-08-03 MED ORDER — PREDNISONE 50 MG PO TABS: ORAL_TABLET | ORAL | 0 refills | Status: DC

## 2018-08-03 MED ORDER — CYCLOBENZAPRINE HCL 5 MG PO TABS: 5.0000 mg | ORAL_TABLET | Freq: Every day | ORAL | 0 refills | Status: DC

## 2018-08-03 NOTE — ED Provider Notes (Signed)
[facility]    CSN: [id number] Arrival date & time: 08/03/18  0909     History   Chief Complaint Chief Complaint  Patient presents with  . Back Pain    HPI Johnathan Goodman is a 32 y.o. male.   This is an initial visit for this 32 year old man who is complaining of back pain.  Patient slipped on his ramp leading out of the house last Wednesday and fell flat on his back.  He had pain for several days and was getting better until this morning slipped again and twisted his back and reinjured it.  Patient's been taking ibuprofen which is helped some.  He has no localized bony tenderness.  He has no weakness or numbness in his legs and the pain is confined to his paralumbar area.     Past Medical History:  Diagnosis Date  . Elevated blood pressure 09/20/2015    Patient Active Problem List   Diagnosis Date Noted  . Left foot pain 04/01/2016  . Dizziness 09/26/2015  . Elevated blood pressure 09/20/2015    History reviewed. No pertinent surgical history.     Home Medications    Prior to Admission medications   Medication Sig Start Date End Date Taking? Authorizing Provider  chlorthalidone (HYGROTON) 25 MG tablet TAKE 1 TABLET(25 MG) BY MOUTH DAILY 12/19/17   Sunnie Nielsen, DO  cyclobenzaprine (FLEXERIL) 5 MG tablet Take 1 tablet (5 mg total) by mouth at bedtime. 08/03/18   Elvina Sidle, MD  predniSONE (DELTASONE) 50 MG tablet One daily with food 08/03/18   Elvina Sidle, MD    Family History Family History  Adopted: Yes    Social History Social History   Tobacco Use  . Smoking status: Never Smoker  . Smokeless tobacco: Never Used  Substance Use Topics  . Alcohol use: Yes    Alcohol/week: 0.0 standard drinks    Comment: infrequent social alcohol consumption   . Drug use: Never     Allergies   Patient has no known allergies.   Review of Systems Review of Systems  Musculoskeletal: Positive for back pain and myalgias. Negative for gait  problem.  Neurological: Negative for dizziness, tremors, syncope, weakness and numbness.  All other systems reviewed and are negative.    Physical Exam Triage Vital Signs ED Triage Vitals  Enc Vitals Group     BP      Pulse      Resp      Temp      Temp src      SpO2      Weight      Height      Head Circumference      Peak Flow      Pain Score      Pain Loc      Pain Edu?      Excl. in GC?    No data found.  Updated Vital Signs BP 139/86 (BP Location: Right Arm)   Pulse 80   Temp 98.4 F (36.9 C) (Oral)   Resp 18   Ht 5\' 9"  (1.753 m)   Wt 133.8 kg   SpO2 98%   BMI 43.56 kg/m    Physical Exam  Constitutional: He is oriented to person, place, and time. He appears well-developed and well-nourished.  HENT:  Right Ear: External ear normal.  Left Ear: External ear normal.  Mouth/Throat: Oropharynx is clear and moist.  Eyes: Conjunctivae are normal.  Neck: Normal range of motion. Neck supple.  Pulmonary/Chest: Effort normal.  Musculoskeletal: He exhibits tenderness. He exhibits no deformity.  Moving carefully  Good symmetrical lower extremity strength and sensation  Tender para lumbar areas.  No bony tenderness in back or hips.  Neurological: He is alert and oriented to person, place, and time. No sensory deficit. Coordination normal.  Skin: Skin is warm and dry.  Psychiatric: He has a normal mood and affect. His behavior is normal. Judgment and thought content normal.  Vitals reviewed.    UC Treatments / Results  Labs (all labs ordered are listed, but only abnormal results are displayed) Labs Reviewed - No data to display  EKG None  Radiology No results found.  Procedures Procedures (including critical care time)  Medications Ordered in UC Medications - No data to display  Initial Impression / Assessment and Plan / UC Course  I have reviewed the triage vital signs and the nursing notes.  Pertinent labs & imaging results that were available  during my care of the patient were reviewed by me and considered in my medical decision making (see chart for details).    Final Clinical Impressions(s) / UC Diagnoses   Final diagnoses:  Lumbar contusion, initial encounter     Discharge Instructions     There is no bony tenderness which would suggest that you don't have a fracture.  You don't have signs of nerve injury either  Rather, this is consistent with bruising and muscle strain in the lumbar area.  This should respond to stronger anti-inflammatory medicine (prednisone) and muscle relaxation at night.    I think your stretches and gentle range of motion, as well as avoiding prolonged sitting, are important in recovery as well.    ED Prescriptions    Medication Sig Dispense Auth. Provider   cyclobenzaprine (FLEXERIL) 5 MG tablet Take 1 tablet (5 mg total) by mouth at bedtime. 7 tablet Elvina SidleLauenstein, Tevin Shillingford, MD   predniSONE (DELTASONE) 50 MG tablet One daily with food 5 tablet Elvina SidleLauenstein, Kayo Zion, MD     Controlled Substance Prescriptions [place] Controlled Substance Registry consulted? Not Applicable   Elvina SidleLauenstein, Deona Novitski, MD 08/03/18 1035

## 2018-08-03 NOTE — Discharge Instructions (Addendum)
There is no bony tenderness which would suggest that you don't have a fracture.  You don't have signs of nerve injury either  Rather, this is consistent with bruising and muscle strain in the lumbar area.  This should respond to stronger anti-inflammatory medicine (prednisone) and muscle relaxation at night.    I think your stretches and gentle range of motion, as well as avoiding prolonged sitting, are important in recovery as well.

## 2018-08-03 NOTE — ED Triage Notes (Signed)
Wednesday am while taking the dog out, pt slipped on ramp and fell flat on his back.  Took a flexeril then, another Thursday morning.  Helped some, and also took ibuprofen.  Now having mid to lower back pain.  Both sides.  Slipped again this am, but caught self.  Having pain on mostly right side today.

## 2018-12-21 ENCOUNTER — Encounter: Payer: BLUE CROSS/BLUE SHIELD | Admitting: Osteopathic Medicine

## 2019-01-20 ENCOUNTER — Ambulatory Visit (INDEPENDENT_AMBULATORY_CARE_PROVIDER_SITE_OTHER): Payer: 59 | Admitting: Osteopathic Medicine

## 2019-01-20 ENCOUNTER — Encounter: Payer: Self-pay | Admitting: Osteopathic Medicine

## 2019-01-20 VITALS — BP 145/91 | HR 77 | Wt 276.2 lb

## 2019-01-20 DIAGNOSIS — I10 Essential (primary) hypertension: Secondary | ICD-10-CM

## 2019-01-20 DIAGNOSIS — M545 Low back pain, unspecified: Secondary | ICD-10-CM

## 2019-01-20 MED ORDER — CYCLOBENZAPRINE HCL 5 MG PO TABS: 5.0000 mg | ORAL_TABLET | Freq: Three times a day (TID) | ORAL | 0 refills | Status: AC | PRN

## 2019-01-20 MED ORDER — CHLORTHALIDONE 25 MG PO TABS: ORAL_TABLET | ORAL | 3 refills | Status: AC

## 2019-01-20 NOTE — Patient Instructions (Signed)
See attached instructions re: low back pain Will continue Ibuprofen, I refilled the muscle relaxers to have on hand as needed  See below re: TENS unit   Will continue current BP meds but goal is 130/80 or less, consistently Keep monitoring BP at home and recording numbers.  Will check back in a couple weeks to go over the numbers.  Lab orders in place for routine monitoring, please get these done fasting sometime in next month or two.        TENS UNIT: This is helpful for muscle pain and spasm.   Search and Purchase a TENS 7000 2nd edition at  www.tenspros.com or www.[employer].com It should be less than $30.     TENS unit instructions: Do not shower or bathe with the unit on . Turn the unit off before removing electrodes or batteries . If the electrodes lose stickiness add a drop of water to the electrodes after they are disconnected from the unit and place on plastic sheet. If you continued to have difficulty, call the TENS unit company to purchase more electrodes. . Do not apply lotion on the skin area prior to use. Make sure the skin is clean and dry as this will help prolong the life of the electrodes. . After use, always check skin for unusual red areas, rash or other skin difficulties. If there are any skin problems, does not apply electrodes to the same area. . Never remove the electrodes from the unit by pulling the wires. . Do not use the TENS unit or electrodes other than as directed. . Do not change electrode placement without consultating your therapist or physician. Marland Kitchen Keep 2 fingers with between each electrode. . Wear time ratio is 2:1, on to off times.    For example on for 30 minutes off for 15 minutes and then on for 30 minutes off for 15 minutes    HIPAA PROTECTED HEALTH INFORMATION WARNING: Please be aware that email communication can be intercepted in transmission or misdirected. Your use of email to communicate protected health information to me indicates you  acknowledge and accept the possible risks associated with such communication. Please consider communicating very sensitive information by phone to the office, by fax or by mail/postal service.

## 2019-01-20 NOTE — Progress Notes (Signed)
Virtual Visit via Video (App used: WebEx) Note  I connected with      Johnathan Goodman on 01/20/19 at 8:50 by a telemedicine application and verified that I am speaking with the correct person using two identifiers.  Patient is at home I am in office    I discussed the limitations of evaluation and management by telemedicine and the availability of in person appointments. The patient expressed understanding and agreed to proceed.  History of Present Illness: Johnathan Goodman is a 33 y.o. male who would like to discuss BP check / refills, back pain    HTN:  BP Readings from Last 3 Encounters:  01/20/19 (!) 145/91  08/03/18 139/86  03/13/18 128/73    Back pain  . Location: lower/lumbar . Quality: soreness, tightness, occasional twinge  . Severity: worse past week or so . Duration: initial injury 07/2018, on and off since then . Modifying factors: was given muscle relaxer by urgent care back in 07/2018, ibuprofen helps  . Assoc signs/symptoms:     Observations/Objective: BP (!) 145/91 (BP Location: Left Arm, Patient Position: Sitting, Cuff Size: Normal)   Pulse 77   Wt 276 lb 3.2 oz (125.3 kg)   BMI 40.79 kg/m  BP Readings from Last 3 Encounters:  01/20/19 (!) 145/91  08/03/18 139/86  03/13/18 128/73   Exam: Normal Speech.  NAD  Lab and Radiology Results No results found for this or any previous visit (from the past 72 hour(s)). No results found.     Assessment and Plan: 33 y.o. male with The primary encounter diagnosis was Essential hypertension. A diagnosis of Right-sided low back pain without sciatica, unspecified chronicity was also pertinent to this visit.   PDMP not reviewed this encounter. Orders Placed This Encounter  Procedures  . CBC  . COMPLETE METABOLIC PANEL WITH GFR  . Lipid panel  . TSH   Meds ordered this encounter  Medications  . cyclobenzaprine (FLEXERIL) 5 MG tablet    Sig: Take 1 tablet (5 mg total) by mouth 3 (three) times daily as  needed for muscle spasms.    Dispense:  30 tablet    Refill:  0  . chlorthalidone (HYGROTON) 25 MG tablet    Sig: TAKE 1 TABLET(25 MG) BY MOUTH DAILY    Dispense:  90 tablet    Refill:  3   Patient Instructions  See attached instructions re: low back pain Will continue Ibuprofen, I refilled the muscle relaxers to have on hand as needed  See below re: TENS unit   Will continue current BP meds but goal is 130/80 or less, consistently Keep monitoring BP at home and recording numbers.  Will check back in a couple weeks to go over the numbers.  Lab orders in place for routine monitoring, please get these done fasting sometime in next month or two.        TENS UNIT: This is helpful for muscle pain and spasm.   Search and Purchase a TENS 7000 2nd edition at  www.tenspros.com or www.Amazon.com It should be less than $30.     TENS unit instructions: Do not shower or bathe with the unit on . Turn the unit off before removing electrodes or batteries . If the electrodes lose stickiness add a drop of water to the electrodes after they are disconnected from the unit and place on plastic sheet. If you continued to have difficulty, call the TENS unit company to purchase more electrodes. . Do not apply lotion on  the skin area prior to use. Make sure the skin is clean and dry as this will help prolong the life of the electrodes. . After use, always check skin for unusual red areas, rash or other skin difficulties. If there are any skin problems, does not apply electrodes to the same area. . Never remove the electrodes from the unit by pulling the wires. . Do not use the TENS unit or electrodes other than as directed. . Do not change electrode placement without consultating your therapist or physician. Marland Kitchen Keep 2 fingers with between each electrode. . Wear time ratio is 2:1, on to off times.    For example on for 30 minutes off for 15 minutes and then on for 30 minutes off for 15 minutes     HIPAA PROTECTED HEALTH INFORMATION WARNING: Please be aware that email communication can be intercepted in transmission or misdirected. Your use of email to communicate protected health information to me indicates you acknowledge and accept the possible risks associated with such communication. Please consider communicating very sensitive information by phone to the office, by fax or by mail/postal service.      Instructions sent via MyChart. If MyChart not available, pt was given option for info via personal e-mail w/ no guarantee of protected health info over unsecured e-mail communication, and MyChart sign-up instructions were included.   Follow Up Instructions: Return in about 2 weeks (around 02/03/2019) for recheck BP (virtual visit ok) reminder set to call patient .    I discussed the assessment and treatment plan with the patient. The patient was provided an opportunity to ask questions and all were answered. The patient agreed with the plan and demonstrated an understanding of the instructions.   The patient was advised to call back or seek an in-person evaluation if any new concerns, if symptoms worsen or if the condition fails to improve as anticipated.  25 minutes of non-face-to-face time was provided during this encounter.                      Historical information moved to improve visibility of documentation.  Past Medical History:  Diagnosis Date  . Elevated blood pressure 09/20/2015   No past surgical history on file. Social History   Tobacco Use  . Smoking status: Never Smoker  . Smokeless tobacco: Never Used  Substance Use Topics  . Alcohol use: Yes    Alcohol/week: 0.0 standard drinks    Comment: infrequent social alcohol consumption    family history is not on file. He was adopted.  Medications: Current Outpatient Medications  Medication Sig Dispense Refill  . chlorthalidone (HYGROTON) 25 MG tablet TAKE 1 TABLET(25 MG) BY MOUTH DAILY 90  tablet 3  . cyclobenzaprine (FLEXERIL) 5 MG tablet Take 1 tablet (5 mg total) by mouth 3 (three) times daily as needed for muscle spasms. 30 tablet 0   No current facility-administered medications for this visit.    No Known Allergies  PDMP not reviewed this encounter. Orders Placed This Encounter  Procedures  . CBC  . COMPLETE METABOLIC PANEL WITH GFR  . Lipid panel  . TSH   Meds ordered this encounter  Medications  . cyclobenzaprine (FLEXERIL) 5 MG tablet    Sig: Take 1 tablet (5 mg total) by mouth 3 (three) times daily as needed for muscle spasms.    Dispense:  30 tablet    Refill:  0  . chlorthalidone (HYGROTON) 25 MG tablet    Sig: TAKE  1 TABLET(25 MG) BY MOUTH DAILY    Dispense:  90 tablet    Refill:  3

## 2019-01-21 ENCOUNTER — Encounter: Payer: BLUE CROSS/BLUE SHIELD | Admitting: Osteopathic Medicine

## 2019-02-03 ENCOUNTER — Telehealth: Payer: Self-pay | Admitting: Osteopathic Medicine

## 2019-02-03 NOTE — Telephone Encounter (Signed)
Contacted pt, aware that labs are due. As per pt, has been really busy, but will attempt to get labs done sometime next week. Pt states he has not been consistent as he needs to in checking his blood pressure. He stated that bp was check on  01/22/19, reading was 136/82, hr 72 and on 02/02/19, reading was 128/80, hr 72. Pt was informed to check bp twice daily at minimum and to document readings. Agrees with plan. No other inquiries during call.

## 2019-02-03 NOTE — Telephone Encounter (Signed)
Left pt a detailed vm msg regarding provider's note/reocmmendation. Direct call back info provided.

## 2019-02-03 NOTE — Telephone Encounter (Signed)
Sounds good, will contact him once I have blood work results back.  These blood pressure readings are okay, he does not have to be checking twice a day every day, checking a few times a week and keeping a record would be fine.  As long as average is 130/80 or less, no need to adjust meds.

## 2019-02-03 NOTE — Telephone Encounter (Signed)
Please call patient: At most recent visit, we talked about him monitoring blood pressure at home and getting blood work done.  I do not see that he has had his labs completed yet, please remind him! Also, what are his blood pressure numbers looking like? Thanks.

## 2019-02-10 ENCOUNTER — Encounter (HOSPITAL_COMMUNITY): Payer: Self-pay | Admitting: Licensed Clinical Social Worker

## 2019-02-10 ENCOUNTER — Ambulatory Visit (INDEPENDENT_AMBULATORY_CARE_PROVIDER_SITE_OTHER): Payer: 59 | Admitting: Licensed Clinical Social Worker

## 2019-02-10 DIAGNOSIS — F411 Generalized anxiety disorder: Secondary | ICD-10-CM

## 2019-02-10 NOTE — Progress Notes (Signed)
Virtual Visit via Video Note  I connected with Johnathan RilyKeith Noffsinger on 02/10/19 at  8:00 AM EDT by a video enabled telemedicine application and verified that I am speaking with the correct person using two identifiers.  Location: Patient: Home Provider: Office   I discussed the limitations of evaluation and management by telemedicine and the availability of in person appointments. The patient expressed understanding and agreed to proceed.     I discussed the assessment and treatment plan with the patient. The patient was provided an opportunity to ask questions and all were answered. The patient agreed with the plan and demonstrated an understanding of the instructions.   The patient was advised to call back or seek an in-person evaluation if the symptoms worsen or if the condition fails to improve as anticipated.  I provided 52 minutes of non-face-to-face time during this encounter.   Margo CommonWesley E Estanislao Harmon, LCAS-A     Comprehensive Clinical Assessment (CCA) Note  02/10/2019 Johnathan Goodman [id number]  Visit Diagnosis:      ICD-10-CM   1. Generalized anxiety disorder F41.1       CCA Part One  Part One has been completed on paper by the patient.  (See scanned document in Chart Review)  CCA Part Two A  Intake/Chief Complaint:  CCA Intake With Chief Complaint CCA Part Two Date: 02/10/19 CCA Part Two Time: 0800 Chief Complaint/Presenting Problem: Anxiety, verbal and behaviorial tics, need coping skills and a better outlook Patients Currently Reported Symptoms/Problems: long hx of anxiety that PT has "dealt with", unwanted coughing, stress from work Individual's Strengths: Good marriage, ego strength, fully employeed for significant time. Individual's Preferences: "need coping skills and to be taught how to have a different outlook" Individual's Abilities: Articulate, talktative, close w/ wife Type of Services Patient Feels Are Needed: individual counseling  Mental Health Symptoms Depression:   Depression: Change in energy/activity, Tearfulness  Mania:     Anxiety:   Anxiety: Difficulty concentrating, Fatigue, Irritability, Restlessness, Sleep, Tension, Worrying  Psychosis:     Trauma:     Obsessions:     Compulsions:  Compulsions: Disrupts with routine/functioning, "Driven" to perform behaviors/acts, Good insight, Repeated behaviors/mental acts(puckering and head turning)  Inattention:     Hyperactivity/Impulsivity:     Oppositional/Defiant Behaviors:     Borderline Personality:     Other Mood/Personality Symptoms:      Mental Status Exam Appearance and self-care  Stature:  Stature: Average  Weight:  Weight: Overweight  Clothing:  Clothing: Neat/clean  Grooming:  Grooming: Normal  Cosmetic use:  Cosmetic Use: None  Posture/gait:  Posture/Gait: Normal  Motor activity:  Motor Activity: Agitated, Repetitive  Sensorium  Attention:  Attention: Persistent  Concentration:  Concentration: Scattered  Orientation:  Orientation: X5  Recall/memory:  Recall/Memory: Normal  Affect and Mood  Affect:  Affect: Blunted, Tearful  Mood:  Mood: Euthymic  Relating  Eye contact:  Eye Contact: Normal  Facial expression:  Facial Expression: Responsive  Attitude toward examiner:  Attitude Toward Examiner: Cooperative  Thought and Language  Speech flow:    Thought content:  Thought Content: Appropriate to mood and circumstances  Preoccupation:     Hallucinations:     Organization:     Company secretaryxecutive Functions  Fund of Knowledge:  Fund of Knowledge: Average  Intelligence:  Intelligence: Above Average  Abstraction:  Abstraction: Normal  Judgement:  Judgement: Normal  Reality Testing:  Reality Testing: Realistic  Insight:  Insight: Fair  Decision Making:  Decision Making: Normal  Social Functioning  Social  Maturity:  Social Maturity: Responsible  Social Judgement:  Social Judgement: Normal  Stress  Stressors:  Stressors: Family conflict, Work  Coping Ability:  Coping Ability: Deficient  supports, Horticulturist, commercial Deficits:     Supports:      Family and Psychosocial History: Family history Marital status: Married Number of Years Married: 8 What types of issues is patient dealing with in the relationship?: Wife is supportive and loving Additional relationship information: Parents are predjudice against PT's wife due to making less money; Parents have never accepted PT's marriage.  Are you sexually active?: Yes What is your sexual orientation?: heterosexual Does patient have children?: No  Childhood History:  Childhood History By whom was/is the patient raised?: Adoptive parents Additional childhood history information: Adopted at birth Description of patient's relationship with caregiver when they were a child: Father was ex Hotel manager, Production designer, theatre/television/film for hardware store, was cold and controling; Pt was closer to mother Patient's description of current relationship with people who raised him/her: Not close to father, mother died of cancer suddenly 10 years ago. How were you disciplined when you got in trouble as a child/adolescent?: sent to my room, yelled at, told that "Parents know best" Does patient have siblings?: No Did patient suffer any verbal/emotional/physical/sexual abuse as a child?: No Did patient suffer from severe childhood neglect?: No Has patient ever been sexually abused/assaulted/raped as an adolescent or adult?: No Was the patient ever a victim of a crime or a disaster?: No Witnessed domestic violence?: No Has patient been effected by domestic violence as an adult?: No  CCA Part Two B  Employment/Work Situation: Employment / Work Psychologist, occupational Employment situation: Employed 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    Education: Education Last Grade Completed: 12 Name of High School: [employer] Did Garment/textile technologist From [employer]?: Yes Did You Attend College?: Yes What Type of College Degree Do you Have?: BA from [employer] What Was Your Major?: English Did You Have Any Special Interests In School?: Dentistry, english; Eventually realized dentistry was too hard for me.   Religion:    Leisure/Recreation: Leisure / Recreation Leisure and Hobbies: video games w/ wife, photography, cooking  Exercise/Diet: Exercise/Diet Do You Exercise?: No Have You Gained or Lost A Significant Amount of Weight in the Past Six Months?: No Do You Follow a Special Diet?: No  CCA Part Two C  Alcohol/Drug Use: Alcohol / Drug Use History of alcohol / drug use?: No history of alcohol / drug abuse                      CCA Part Three  ASAM's:  Six Dimensions of Multidimensional Assessment  Dimension 1:  Acute Intoxication and/or Withdrawal Potential:     Dimension 2:  Biomedical Conditions and Complications:     Dimension 3:  Emotional, Behavioral, or Cognitive Conditions and Complications:     Dimension 4:  Readiness to Change:     Dimension 5:  Relapse, Continued use, or Continued Problem Potential:     Dimension 6:  Recovery/Living Environment:      Substance use Disorder (SUD)    Social Function:  Social Functioning Social Maturity: Responsible Social Judgement: Normal  Stress:  Stress Stressors: Family conflict, Work Coping Ability: Deficient supports, Exhausted Patient  Takes Medications The Way The Doctor Instructed?: Yes Priority Risk: Low Acuity  Risk Assessment- Self-Harm Potential: Risk Assessment For Self-Harm Potential Thoughts of Self-Harm: No current thoughts Method: No plan  Risk Assessment -Dangerous to Others Potential: Risk Assessment For Dangerous to Others Potential Method: No Plan  DSM5 Diagnoses: Patient Active Problem List   Diagnosis Date Noted  .  Essential hypertension 01/20/2019  . Left foot pain 04/01/2016  . Dizziness 09/26/2015  . Elevated blood pressure 09/20/2015    Patient Centered Plan: Patient is on the following Treatment Plan(s):  Anxiety  Recommendations for Services/Supports/Treatments: Recommendations for Services/Supports/Treatments Recommendations For Services/Supports/Treatments: Individual Therapy  Treatment Plan Summary: OP Treatment Plan Summary: "I need help w/ my anxiety and behavioral tics"  Referrals to Alternative Service(s): Referred to Alternative Service(s):   Place:   Date:   Time:    Referred to Alternative Service(s):   Place:   Date:   Time:    Referred to Alternative Service(s):   Place:   Date:   Time:    Referred to Alternative Service(s):   Place:   Date:   Time:     Margo Common

## 2019-02-18 ENCOUNTER — Encounter: Payer: Self-pay | Admitting: Osteopathic Medicine

## 2019-02-24 ENCOUNTER — Ambulatory Visit (INDEPENDENT_AMBULATORY_CARE_PROVIDER_SITE_OTHER): Payer: 59 | Admitting: Licensed Clinical Social Worker

## 2019-02-24 ENCOUNTER — Encounter (HOSPITAL_COMMUNITY): Payer: Self-pay | Admitting: Licensed Clinical Social Worker

## 2019-02-24 DIAGNOSIS — F411 Generalized anxiety disorder: Secondary | ICD-10-CM

## 2019-02-24 NOTE — Progress Notes (Signed)
Virtual Visit via Video Note  I connected with Johnathan Goodman on 02/24/19 at  8:00 AM EDT by a video enabled telemedicine application and verified that I am speaking with the correct person using two identifiers.  Location: Patient: Home Provider: Office   I discussed the limitations of evaluation and management by telemedicine and the availability of in person appointments. The patient expressed understanding and agreed to proceed.     I discussed the assessment and treatment plan with the patient. The patient was provided an opportunity to ask questions and all were answered. The patient agreed with the plan and demonstrated an understanding of the instructions.   The patient was advised to call back or seek an in-person evaluation if the symptoms worsen or if the condition fails to improve as anticipated.  I provided 52 minutes of non-face-to-face time during this encounter.   Archie Balboa, LCAS-A    THERAPIST PROGRESS NOTE  Session Time: 8-9AM  Participation Level: Active  Behavioral Response: Well GroomedAlertAnxious and Euthymic  Type of Therapy: Individual Therapy  Treatment Goals addressed: Anxiety  Interventions: CBT and Supportive  Summary: Johnathan Goodman is a 33 y.o. male who presents with hx of Anxiety and Depression. He discusses plans he has made w/ his birth mother for her to visit and meet him for the first time in person. He discusses his past hx of feeling ashamed and guilty for "the smallest thing" growing up. He attributes this to his parents who "confused him" by communicating a mixed message in regards to their approval of him. PT becomes tearful and agitated and tics return for first time in session when we discuss the shame.    Suicidal/Homicidal: Nowithout intent/plan  Therapist Response: I used open questions, active listening, and supportive encouragement. I continued to work on building therapeutic rapport and asking questions that help PT explore his  experience of his family members and unconscious themes in his life. I invited PT to begin documenting his times of negative self talk and shame in a daily mood journal.   Plan: Return again in 2 weeks.  Diagnosis:    ICD-10-CM   1. Generalized anxiety disorder  F41.[place] Swan, LCAS-A 02/24/2019

## 2019-03-10 ENCOUNTER — Ambulatory Visit (HOSPITAL_COMMUNITY): Payer: 59 | Admitting: Licensed Clinical Social Worker

## 2019-03-11 ENCOUNTER — Ambulatory Visit (INDEPENDENT_AMBULATORY_CARE_PROVIDER_SITE_OTHER): Payer: 59 | Admitting: Licensed Clinical Social Worker

## 2019-03-11 ENCOUNTER — Encounter (HOSPITAL_COMMUNITY): Payer: Self-pay | Admitting: Licensed Clinical Social Worker

## 2019-03-11 DIAGNOSIS — F411 Generalized anxiety disorder: Secondary | ICD-10-CM

## 2019-03-11 NOTE — Progress Notes (Signed)
Virtual Visit via Video Note  I connected with Johnathan Goodman on 03/11/19 at  8:00 AM EDT by a video enabled telemedicine application and verified that I am speaking with the correct person using two identifiers.  Location: Patient: Home Provider: Office   I discussed the limitations of evaluation and management by telemedicine and the availability of in person appointments. The patient expressed understanding and agreed to proceed.    I discussed the assessment and treatment plan with the patient. The patient was provided an opportunity to ask questions and all were answered. The patient agreed with the plan and demonstrated an understanding of the instructions.   The patient was advised to call back or seek an in-person evaluation if the symptoms worsen or if the condition fails to improve as anticipated.  I provided 52 minutes of non-face-to-face time during this encounter.   Archie Balboa, LCAS-A    THERAPIST PROGRESS NOTE  Session Time: 8-9AM  Participation Level: Active  Behavioral Response: Well GroomedAlertEuthymic and tearful  Type of Therapy: Individual Therapy  Treatment Goals addressed: Anxiety  Interventions: CBT  Summary: Johnathan Goodman is a 33 y.o. male who presents with hx of anxiety. 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 He plans to f/u w/ them in 1 month. They are asking him to document his tics daily. PT states his feelings of guilt are not as frequent. He discusses his recent feelings of guilt when watching a [employer] Rally and one night staying up late remembering an early memory of his parents arguing w/ aunt and uncle after death of their father. PT explores his feelings and thoughts regarding this early traumatic memory. He states he can feel the room, smell the house, and remember the exact feelings "as if he was there". PT remembers loud arguing and he walks back into the room and throws a TV remote and demands the adults stop arguing. PT  then went to porch alone, sobbing. He remembers trying to be consoled by his mother and feeling guilty for his "outburst". I spent time reframing the events in light of his age and confusion at the time. I recommend PT continue to document his thoughts and feelings of guilt and anxiety.   Suicidal/Homicidal: Nowithout intent/plan  Therapist Response: Counselor used open questions, active listening, and reframing. I helped PT explore his early adverse childhood experiences w/ intent of making new meaning and resolving feelings of shame and guilt. I helped PT identify his core beliefs which were developed following this memory. I encouraged PT to continue to document in writing since he states this helps him create distance from his feelings and "not feel that I have to be re-triggered into them".   Plan: Return again in 1 weeks.  Diagnosis:    ICD-10-CM   1. Generalized anxiety disorder  F41.[place]vel, LCAS-A 03/11/2019

## 2019-03-18 ENCOUNTER — Ambulatory Visit (INDEPENDENT_AMBULATORY_CARE_PROVIDER_SITE_OTHER): Payer: 59 | Admitting: Licensed Clinical Social Worker

## 2019-03-18 DIAGNOSIS — F411 Generalized anxiety disorder: Secondary | ICD-10-CM

## 2019-03-18 NOTE — Progress Notes (Signed)
Virtual Visit via Video Note  I connected with Johnathan Goodman on 03/18/19 at  8:00 AM EDT by a video enabled telemedicine application and verified that I am speaking with the correct person using two identifiers.  Location: Patient: Home Provider: Office   I discussed the limitations of evaluation and management by telemedicine and the availability of in person appointments. The patient expressed understanding and agreed to proceed.    I discussed the assessment and treatment plan with the patient. The patient was provided an opportunity to ask questions and all were answered. The patient agreed with the plan and demonstrated an understanding of the instructions.   The patient was advised to call back or seek an in-person evaluation if the symptoms worsen or if the condition fails to improve as anticipated.  I provided 52 minutes of non-face-to-face time during this encounter.   Archie Balboa, LCAS-A    THERAPIST PROGRESS NOTE  Session Time: 8-9AM  Participation Level: Active  Behavioral Response: Well GroomedAlertEuthymic  Type of Therapy: Individual Therapy  Treatment Goals addressed: Anxiety  Interventions: CBT  Summary: Johnathan Goodman is a 33 y.o. male who presents with GAD. He is upbeat, active, engaged and relaxed in session. Counselor conducts a GAD7, results show significant decrease in anxiety from 02/10/19. PT is continuing to chart his tics as recommended by tic specialists. He reports he is better able to sit w/ his thoughts, allow thoughts to come and go easier. We discuss PT's core beliefs of feelings unworthy and like an imposter. Counselor helps PT connect these feelings and beliefs to his wounded inner child who was told by his parents that he "just needed to follow their rules w/o questioning them".   Suicidal/Homicidal: Nowithout intent/plan  Therapist Response: I used open questions, active listening, and supportive reflection. I helped PT identify early formulated  core beliefs related to his worth and feelings of acceptance by his adoptive parents. PT is responded positively to CBT based questioning and challenging of negative core beliefs. PT anticipates a challenge 2 weeks at work and wants to begin to meet less frequently.   Plan: Return again in 2 weeks.  Diagnosis:    ICD-10-CM   1. Generalized anxiety disorder  F41.[place] [name], LCAS-A 03/18/2019

## 2019-03-30 ENCOUNTER — Ambulatory Visit (INDEPENDENT_AMBULATORY_CARE_PROVIDER_SITE_OTHER): Payer: 59 | Admitting: Licensed Clinical Social Worker

## 2019-03-30 ENCOUNTER — Encounter (HOSPITAL_COMMUNITY): Payer: Self-pay | Admitting: Licensed Clinical Social Worker

## 2019-03-30 DIAGNOSIS — F411 Generalized anxiety disorder: Secondary | ICD-10-CM

## 2019-03-30 NOTE — Progress Notes (Signed)
Virtual Visit via Telephone Note  I connected with Johnathan Goodman on 03/30/19 at  8:00 AM EDT by telephone and verified that I am speaking with the correct person using two identifiers.  Location: Patient: Home Provider: Office   I discussed the limitations, risks, security and privacy concerns of performing an evaluation and management service by telephone and the availability of in person appointments. I also discussed with the patient that there may be a patient responsible charge related to this service. The patient expressed understanding and agreed to proceed.       I discussed the assessment and treatment plan with the patient. The patient was provided an opportunity to ask questions and all were answered. The patient agreed with the plan and demonstrated an understanding of the instructions.   The patient was advised to call back or seek an in-person evaluation if the symptoms worsen or if the condition fails to improve as anticipated.  I provided 60 minutes of non-face-to-face time during this encounter.   Archie Balboa, LCAS-A    THERAPIST PROGRESS NOTE  Session Time: 8-9  Participation Level: Active  Behavioral Response: NAAlertEuthymic  Type of Therapy: Individual Therapy  Treatment Goals addressed: Anxiety  Interventions: CBT and Supportive  Summary: Johnathan Goodman is a 33 y.o. male who presents with hx of anxiety and tic disorder. I connected w/ Johnathan Goodman via telephone since power is unavailable in my office when I arrive at work this morning. PT is agreeable to telephone session. He reports he had a challenging weekend, experienced multiple days of anxiety and panic causing him to miss work on Friday and Monday. He states he had episodic tics over the weekend. He was better able to accept help from his wife who is very supportive and encouraging of PT in these times. PT continues to report feeling guilty for "being this way". We discuss ways to increase self compassion and  challenge automatic negative thoughts.   Suicidal/Homicidal: Nowithout intent/plan  Therapist Response: I used open questions, affirmations, and CBT based challenges to negative thinking. We discussed PT's core beliefs, and how to increase self compassion.  Plan: Return again in 2 weeks.  Diagnosis:    ICD-10-CM   1. Generalized anxiety disorder  F41.[place] , LCAS-A 03/30/2019

## 2019-04-13 ENCOUNTER — Encounter (HOSPITAL_COMMUNITY): Payer: Self-pay | Admitting: Licensed Clinical Social Worker

## 2019-04-13 ENCOUNTER — Other Ambulatory Visit: Payer: Self-pay

## 2019-04-13 ENCOUNTER — Ambulatory Visit (INDEPENDENT_AMBULATORY_CARE_PROVIDER_SITE_OTHER): Payer: 59 | Admitting: Licensed Clinical Social Worker

## 2019-04-13 DIAGNOSIS — F411 Generalized anxiety disorder: Secondary | ICD-10-CM

## 2019-04-13 NOTE — Progress Notes (Signed)
Virtual Visit via Video Note  I connected with Johnathan Goodman on 04/13/19 at  8:00 AM EDT by a video enabled telemedicine application and verified that I am speaking with the correct person using two identifiers.  Location: Patient: Home Provider: Office   I discussed the limitations of evaluation and management by telemedicine and the availability of in person appointments. The patient expressed understanding and agreed to proceed.   I discussed the assessment and treatment plan with the patient. The patient was provided an opportunity to ask questions and all were answered. The patient agreed with the plan and demonstrated an understanding of the instructions.   The patient was advised to call back or seek an in-person evaluation if the symptoms worsen or if the condition fails to improve as anticipated.  I provided 52 minutes of non-face-to-face time during this encounter.   Archie Balboa, LCAS-A    THERAPIST PROGRESS NOTE  Session Time: 8-9  Participation Level: Active  Behavioral Response: Well GroomedAlertEuthymic  Type of Therapy: Individual Therapy  Treatment Goals addressed: Anxiety  Interventions: CBT and Supportive  Summary: Johnathan Goodman is a 33 y.o. male who presents with long hx of Generalized Anxiety Disorder. He is engaged and thoughtful in session. We discuss his current problem of whether or not he wants to go back to his father's house to get the remainder of his posessions. PT has not talked to his father in 2 years and we discuss the emotional baggage associated w/ his father and that house. I use MI to help PT resolve his ambivalence and come closer to a decision. I use supportive and coaching encouragement to remind him of progress he has made and new supports in place. I reframe PT's anxiety as "concerns based in past expereinces". PT is agreeable to these interventions and confirms that he wants his things but he does not want to "go back home". I remind him he  "has a new home and he is going to visit a frustrating place". PT agrees and smiles.   Suicidal/Homicidal: Nowithout intent/plan  Therapist Response: I used open questions, active listening, challenging thoughts, and encouragement. I remind PT of his intentions and desires rather and frame his fears as rational and yet "based on old information". PT responds positively and states he hopes to go get his possessions soon.   Plan: Return again in 2 weeks.  Diagnosis:    ICD-10-CM   1. Generalized anxiety disorder  F41.[place] , LCAS-A 04/13/2019

## 2019-04-27 ENCOUNTER — Encounter (HOSPITAL_COMMUNITY): Payer: Self-pay | Admitting: Licensed Clinical Social Worker

## 2019-04-27 ENCOUNTER — Other Ambulatory Visit: Payer: Self-pay

## 2019-04-27 ENCOUNTER — Ambulatory Visit (INDEPENDENT_AMBULATORY_CARE_PROVIDER_SITE_OTHER): Payer: 59 | Admitting: Licensed Clinical Social Worker

## 2019-04-27 DIAGNOSIS — F411 Generalized anxiety disorder: Secondary | ICD-10-CM | POA: Diagnosis not present

## 2019-04-27 NOTE — Progress Notes (Signed)
Virtual Visit via Video Note  I connected with Johnathan Goodman on 04/27/19 at  8:00 AM EDT by a video enabled telemedicine application and verified that I am speaking with the correct person using two identifiers.  Location: Patient: Home Provider: Office   I discussed the limitations of evaluation and management by telemedicine and the availability of in person appointments. The patient expressed understanding and agreed to proceed.   I discussed the assessment and treatment plan with the patient. The patient was provided an opportunity to ask questions and all were answered. The patient agreed with the plan and demonstrated an understanding of the instructions.   The patient was advised to call back or seek an in-person evaluation if the symptoms worsen or if the condition fails to improve as anticipated.  I provided 50 minutes of non-face-to-face time during this encounter.   Archie Balboa, LCAS-A    THERAPIST PROGRESS NOTE  Session Time: 8-9:52  Participation Level: Active  Behavioral Response: Well GroomedAlertEuthymic  Type of Therapy: Individual Therapy  Treatment Goals addressed: Anxiety  Interventions: CBT and Reframing  Summary: Johnathan Goodman is a 33 y.o. male who presents with hx of anxiety. He has blunted affect and some mild tearfulness when discussing his hx of feeling like "he couldn't win" with his parents as a child. Counselor and PT reflect on the feelings that arise in PT when he considers "not being heard as a child".  Reports he is coping better with his negative memories of childhood.   Suicidal/Homicidal: Nowithout intent/plan  Therapist Response: I used open questions, active listening, and reframing techniques to help PT make meaning of his childhood experiences. I challenged Pt's cognitions and asked him to consider alternative perspectives when thinking about his core beliefs. PT reports parents were "confining". I worked to help PT consider ways that he  "confined himself" as a child and believed the inner voice of "feeling like a problem to his parents".   Plan: Return again in 2 weeks.  Diagnosis:    ICD-10-CM   1. Generalized anxiety disorder  F41.[place] , LCAS-A 04/27/2019

## 2019-05-13 ENCOUNTER — Encounter (HOSPITAL_COMMUNITY): Payer: Self-pay | Admitting: Licensed Clinical Social Worker

## 2019-05-13 ENCOUNTER — Ambulatory Visit (INDEPENDENT_AMBULATORY_CARE_PROVIDER_SITE_OTHER): Payer: 59 | Admitting: Licensed Clinical Social Worker

## 2019-05-13 ENCOUNTER — Other Ambulatory Visit: Payer: Self-pay

## 2019-05-13 DIAGNOSIS — F411 Generalized anxiety disorder: Secondary | ICD-10-CM

## 2019-05-13 NOTE — Progress Notes (Signed)
Virtual Visit via Video Note  I connected with Johnathan Goodman on 05/13/19 at  8:00 AM EDT by a video enabled telemedicine application and verified that I am speaking with the correct person using two identifiers.  Location: Patient: Home Provider: Home Office   I discussed the limitations of evaluation and management by telemedicine and the availability of in person appointments. The patient expressed understanding and agreed to proceed.    I discussed the assessment and treatment plan with the patient. The patient was provided an opportunity to ask questions and all were answered. The patient agreed with the plan and demonstrated an understanding of the instructions.   The patient was advised to call back or seek an in-person evaluation if the symptoms worsen or if the condition fails to improve as anticipated.  I provided 50 minutes of non-face-to-face time during this encounter.   Archie Balboa, LCAS-A    THERAPIST PROGRESS NOTE  Session Time: 8-9AM  Participation Level: Active  Behavioral Response: Well GroomedAlertEuthymic  Type of Therapy: Individual Therapy  Treatment Goals addressed: Coping  Interventions: CBT and Supportive  Summary: Johnathan Goodman is a 33 y.o. male who presents with hx of anxiety and low self esteem. He is open, active, engaged, upbeat in session. He reports he has been having more positive days, letting go of stressors easier, and not feeling compelled to fixate on anxieties.    Suicidal/Homicidal: Nowithout intent/plan  Therapist Response: I used open questions, active listening, and supportive refelection. I help PT identify changes in his bx and cognitions that are contributing to his increased peace. We agree that PT will attempt to go 4 weeks w/o an appointment.  Plan: Return again in 4 weeks.  Diagnosis:    ICD-10-CM   1. Generalized anxiety disorder  F41.[name], LCAS-A 05/13/2019

## 2019-06-07 ENCOUNTER — Ambulatory Visit (HOSPITAL_COMMUNITY): Payer: 59 | Admitting: Licensed Clinical Social Worker

## 2019-06-07 ENCOUNTER — Encounter (HOSPITAL_COMMUNITY): Payer: Self-pay | Admitting: Licensed Clinical Social Worker

## 2019-06-07 ENCOUNTER — Ambulatory Visit (INDEPENDENT_AMBULATORY_CARE_PROVIDER_SITE_OTHER): Payer: 59 | Admitting: Licensed Clinical Social Worker

## 2019-06-07 DIAGNOSIS — F411 Generalized anxiety disorder: Secondary | ICD-10-CM

## 2019-06-07 NOTE — Progress Notes (Signed)
Virtual Visit via Video Note  I connected with Johnathan Goodman on 06/07/19 at  8:00 AM EDT by a video enabled telemedicine application and verified that I am speaking with the correct person using two identifiers.  Location: Patient: Home Provider: Office   I discussed the limitations of evaluation and management by telemedicine and the availability of in person appointments. The patient expressed understanding and agreed to proceed.    I discussed the assessment and treatment plan with the patient. The patient was provided an opportunity to ask questions and all were answered. The patient agreed with the plan and demonstrated an understanding of the instructions.   The patient was advised to call back or seek an in-person evaluation if the symptoms worsen or if the condition fails to improve as anticipated.  I provided 50 minutes of non-face-to-face time during this encounter.   Archie Balboa, LCAS-A    THERAPIST PROGRESS NOTE  Session Time: 8-9  Participation Level: Active  Behavioral Response: Well GroomedAlertEuthymic  Type of Therapy: Individual Therapy  Treatment Goals addressed: Anxiety  Interventions: CBT and Supportive  Summary: Johnathan Goodman is a 33 y.o. male who presents with hx of GAD. He is sleepy but active and talkative in our virtual session. He talks extensively about feeling grateful for his best friends whom he considers "family". He explains how he feels that he has chosen his family and this fits w/ his values. He reports anxiety is down, negative self talk is down, and feelings of gratitude are up. He reports he is "taking more action to reduce stress early before it feels out of control". He endorses utilizing mindfulness of the moment and breaking tasks and stressors into small steps. He wants to continue to meet monthly since he feels he is improving.    Suicidal/Homicidal: Nowithout intent/plan  Therapist Response: I used open questions, acitve listening, and  reframing. I encouraged PT and asked for clarification of meaning of "family" and "progress" for him these past few months.   Plan: Return again in 4 weeks.  Diagnosis:    ICD-10-CM   1. Generalized anxiety disorder  F41.[place] Keiasia Christianson, LCAS-A 06/07/2019

## 2019-07-05 ENCOUNTER — Ambulatory Visit (INDEPENDENT_AMBULATORY_CARE_PROVIDER_SITE_OTHER): Payer: 59 | Admitting: Licensed Clinical Social Worker

## 2019-07-05 ENCOUNTER — Encounter (HOSPITAL_COMMUNITY): Payer: Self-pay | Admitting: Licensed Clinical Social Worker

## 2019-07-05 ENCOUNTER — Other Ambulatory Visit: Payer: Self-pay

## 2019-07-05 DIAGNOSIS — F411 Generalized anxiety disorder: Secondary | ICD-10-CM | POA: Diagnosis not present

## 2019-07-05 NOTE — Progress Notes (Signed)
Virtual Visit via Video Note  I connected with Johnathan Goodman on 07/05/19 at  8:00 AM EDT by a video enabled telemedicine application and verified that I am speaking with the correct person using two identifiers.  Location: Patient: Home Provider: Office   I discussed the limitations of evaluation and management by telemedicine and the availability of in person appointments. The patient expressed understanding and agreed to proceed.     I discussed the assessment and treatment plan with the patient. The patient was provided an opportunity to ask questions and all were answered. The patient agreed with the plan and demonstrated an understanding of the instructions.   The patient was advised to call back or seek an in-person evaluation if the symptoms worsen or if the condition fails to improve as anticipated.  I provided 50 minutes of non-face-to-face time during this encounter.   Archie Balboa, LCAS-A    THERAPIST PROGRESS NOTE  Session Time: 8-9   Participation Level: Active  Behavioral Response: Well GroomedAlertEuthymic  Type of Therapy: Individual Therapy  Treatment Goals addressed: Anxiety  Interventions: Supportive  Summary: Kalieb Freeland is a 33 y.o. male who presents with hx of GAD. He is active, engaged, talkative, and upbeat in session. He states he has benefitted greatly from counseling and he now practices mindfulness and breathing skills regularly to control his anxiety. He also uses CBT-based cognitive challenging. He has been encouraged by his work to [employer] risk [employer]" app which he states is helping. He notes his confidence is improving. We spend some time reflecting on his core beliefs that developed in childhood. PT states he feels more cognitively and emotionally flexible and supported by his S/O. We agree that no further appointments are needed at this time.    Suicidal/Homicidal: Nowithout intent/plan  Therapist Response: I used open questions, f/u questions, and  reflective questions to help PT better process the effects of counseling the past few months.   Plan: D/C successful  Diagnosis:    ICD-10-CM   1. Generalized anxiety disorder  F41.[place] Pasion, LCAS-A 07/05/2019
